# Patient Record
Sex: Female | Born: 1975 | Race: White | Hispanic: Yes | Marital: Single | Smoking: Never smoker
Health system: Southern US, Community
[De-identification: ages and names within clinical notes are randomized; demographics above are authoritative.]

## PROBLEM LIST (undated history)

## (undated) DIAGNOSIS — F32A Depression, unspecified: Secondary | ICD-10-CM

## (undated) DIAGNOSIS — R519 Headache, unspecified: Secondary | ICD-10-CM

## (undated) DIAGNOSIS — G039 Meningitis, unspecified: Secondary | ICD-10-CM

## (undated) DIAGNOSIS — G8929 Other chronic pain: Secondary | ICD-10-CM

## (undated) HISTORY — DX: Headache, unspecified: R51.9

## (undated) HISTORY — DX: Meningitis, unspecified: G03.9

## (undated) HISTORY — PX: TUBAL LIGATION: SHX77

## (undated) HISTORY — DX: Other chronic pain: G89.29

## (undated) HISTORY — DX: Depression, unspecified: F32.A

---

## 2007-04-27 ENCOUNTER — Inpatient Hospital Stay (HOSPITAL_COMMUNITY): Admission: AD | Admit: 2007-04-27 | Discharge: 2007-04-27 | Payer: Self-pay | Admitting: Obstetrics & Gynecology

## 2007-04-28 ENCOUNTER — Ambulatory Visit (HOSPITAL_COMMUNITY): Admission: RE | Admit: 2007-04-28 | Discharge: 2007-04-28 | Payer: Self-pay | Admitting: Obstetrics & Gynecology

## 2007-04-28 ENCOUNTER — Ambulatory Visit: Payer: Self-pay | Admitting: Family Medicine

## 2007-05-12 ENCOUNTER — Ambulatory Visit (HOSPITAL_COMMUNITY): Admission: RE | Admit: 2007-05-12 | Discharge: 2007-05-12 | Payer: Self-pay | Admitting: Obstetrics & Gynecology

## 2007-05-12 ENCOUNTER — Ambulatory Visit: Payer: Self-pay | Admitting: Obstetrics & Gynecology

## 2007-05-19 ENCOUNTER — Ambulatory Visit (HOSPITAL_COMMUNITY): Admission: RE | Admit: 2007-05-19 | Discharge: 2007-05-19 | Payer: Self-pay | Admitting: Obstetrics & Gynecology

## 2007-05-26 ENCOUNTER — Encounter: Payer: Self-pay | Admitting: Obstetrics and Gynecology

## 2007-05-26 ENCOUNTER — Ambulatory Visit: Payer: Self-pay | Admitting: Family Medicine

## 2007-06-02 ENCOUNTER — Ambulatory Visit: Payer: Self-pay | Admitting: Obstetrics & Gynecology

## 2007-06-09 ENCOUNTER — Ambulatory Visit (HOSPITAL_COMMUNITY): Admission: RE | Admit: 2007-06-09 | Discharge: 2007-06-09 | Payer: Self-pay | Admitting: Obstetrics & Gynecology

## 2008-04-19 IMAGING — US US OB FOLLOW-UP
1 series · 14 of 28 positions shown · non-contrast
Comparison: none

OBSTETRICAL ULTRASOUND:
 This ultrasound was performed in The [HOSPITAL], and the [facility] report will be stored to [REDACTED] PACS.

[Series 1: us ob follow-up · 14 of 42 slices shown]
[im 2/42]
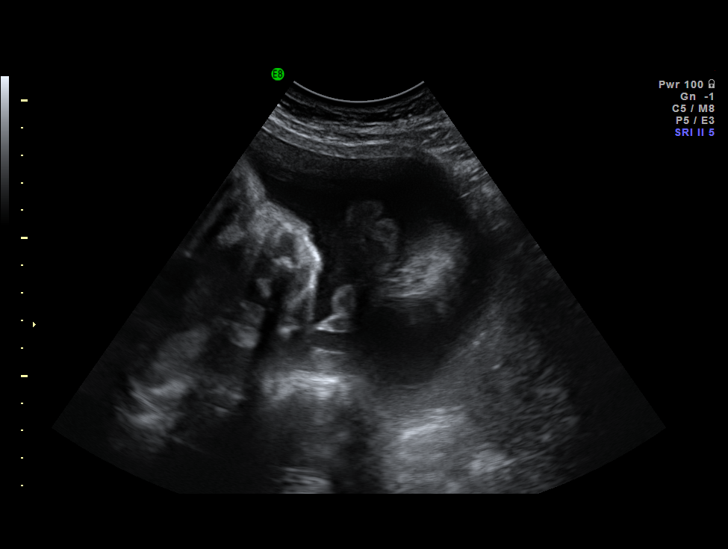
[im 5/42]
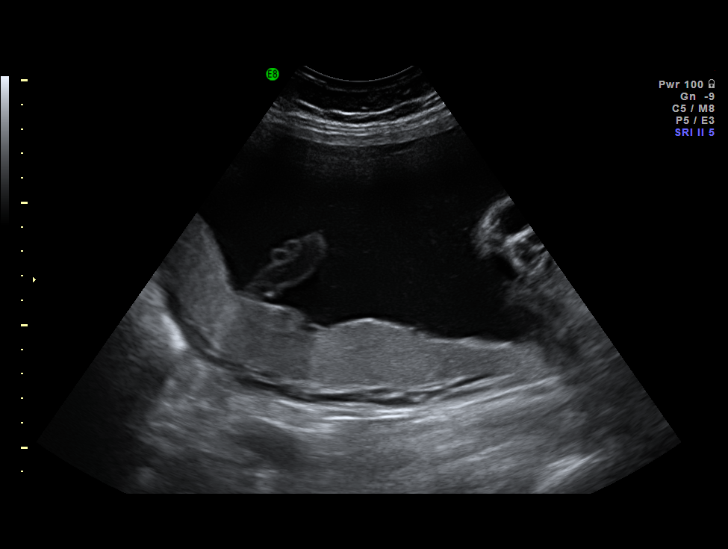
[im 8/42]
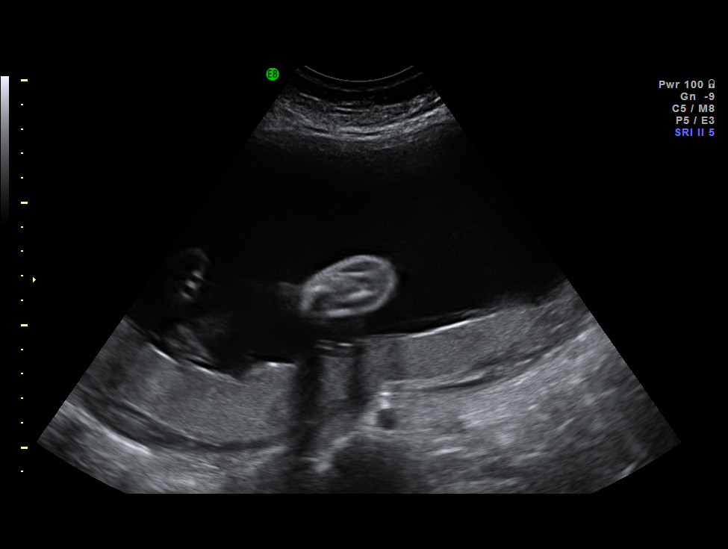
[im 11/42]
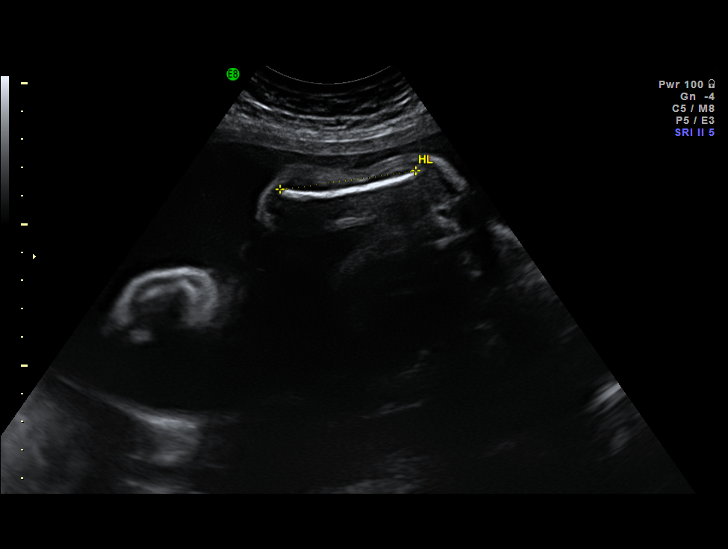
[im 14/42]
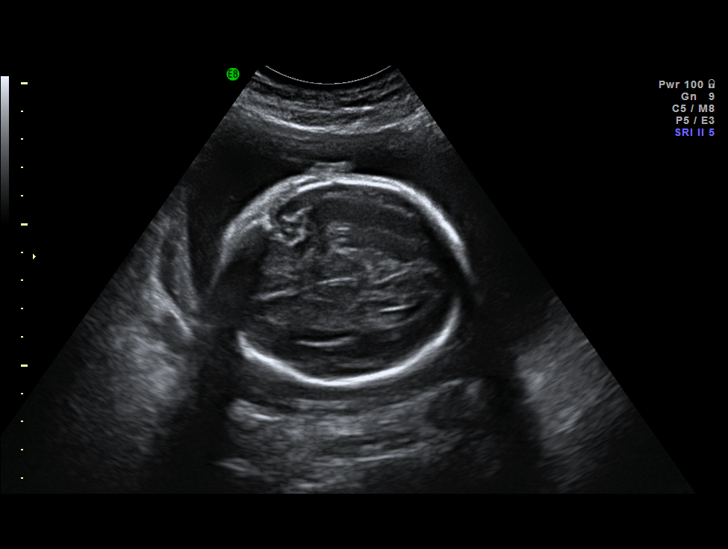
[im 17/42]
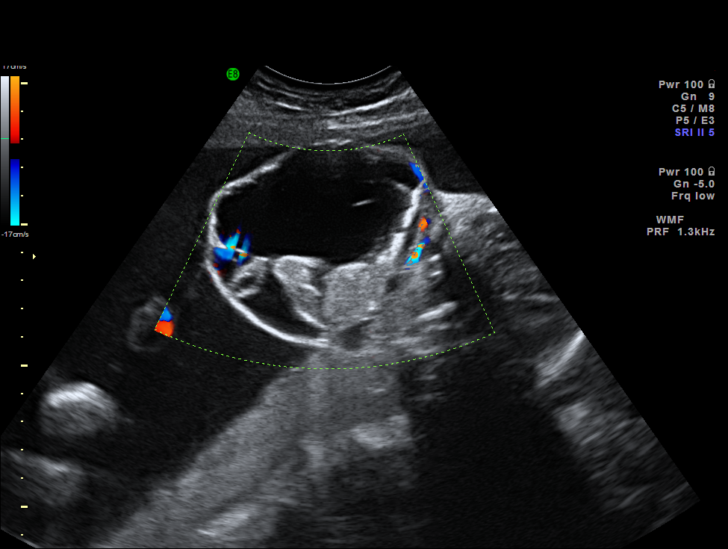
[im 20/42]
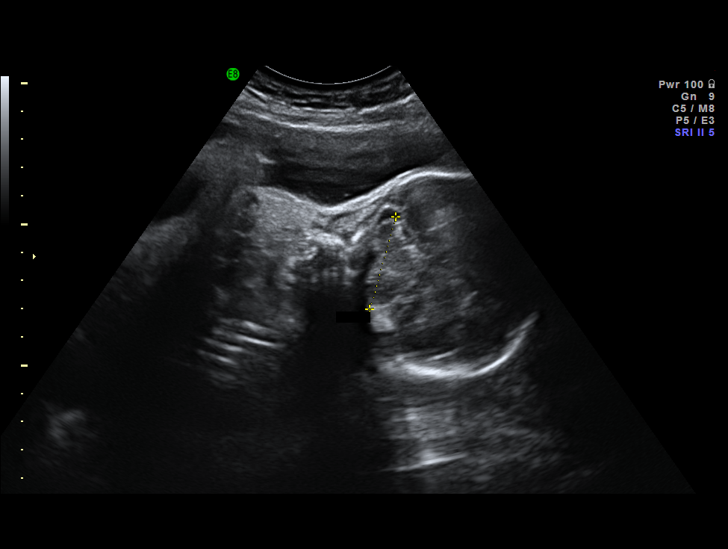
[im 23/42]
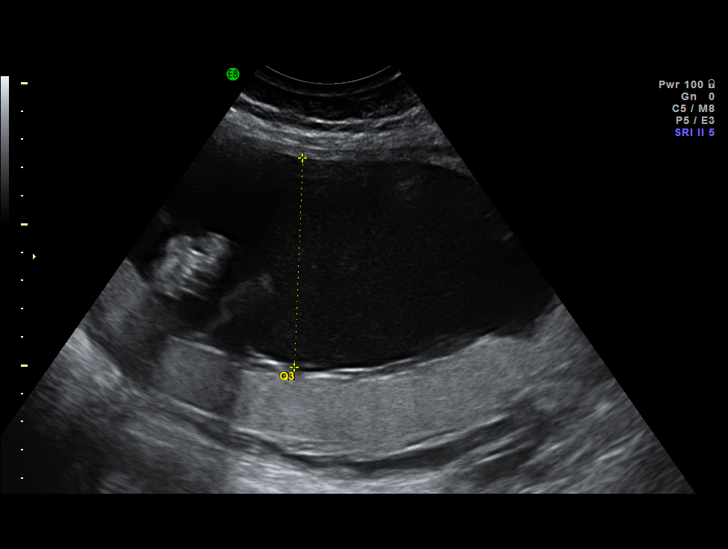
[im 26/42]
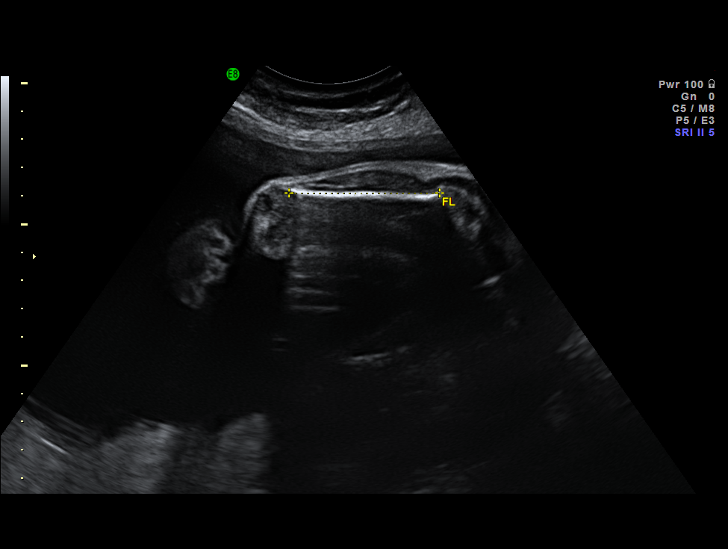
[im 29/42]
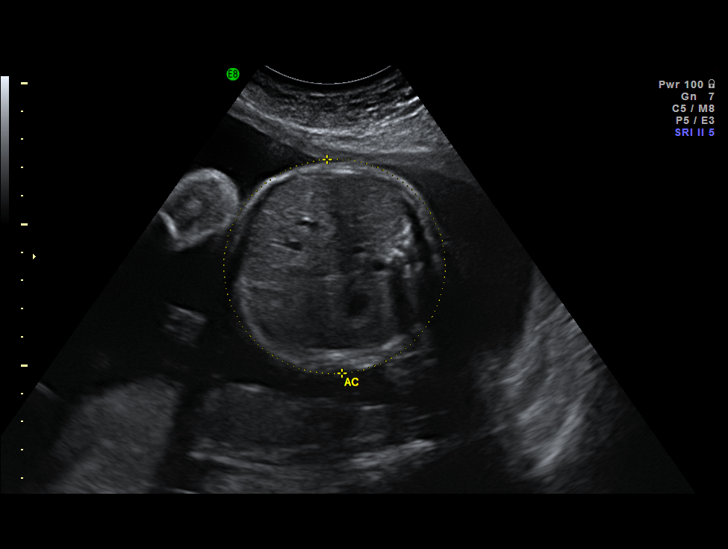
[im 32/42]
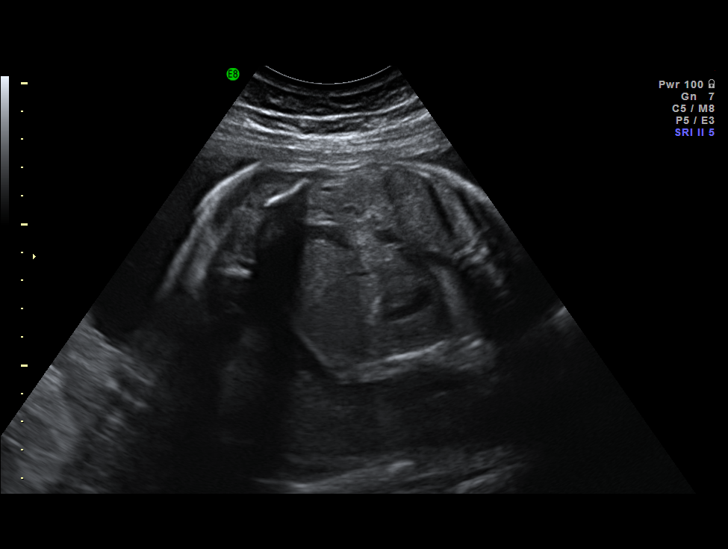
[im 35/42]
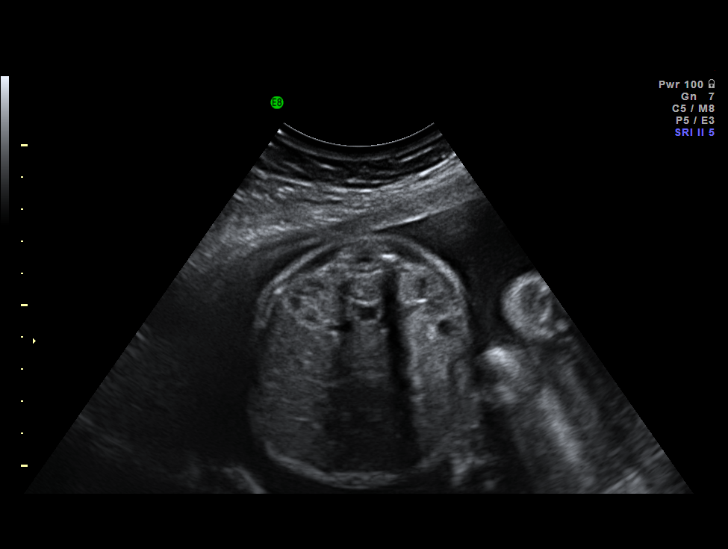
[im 38/42]
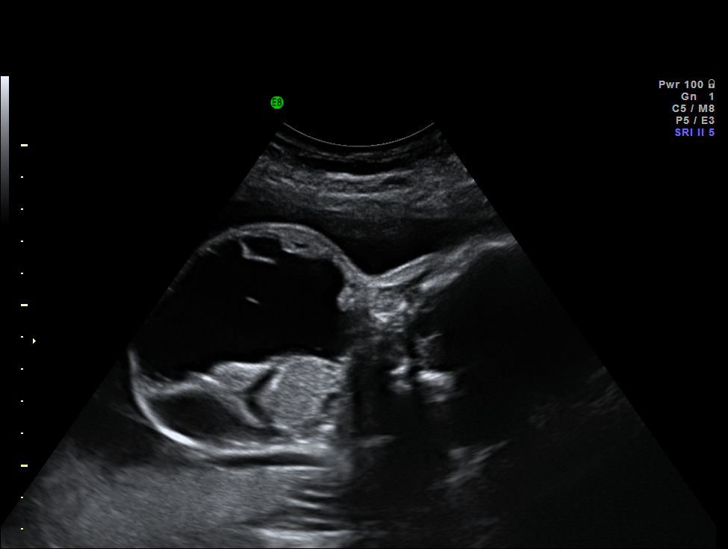
[im 42/42]
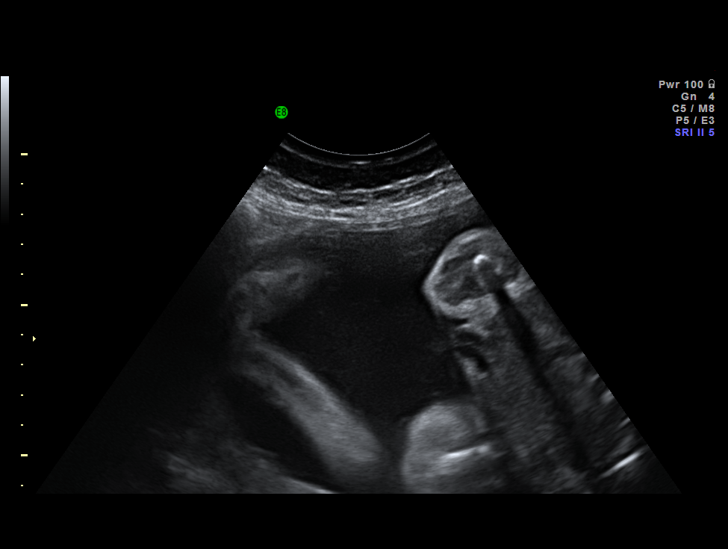

[14 of 28 positions shown; findings below may reference images not displayed]

IMPRESSION: The [facility] report has also been faxed to the ordering physician.

## 2009-11-01 ENCOUNTER — Inpatient Hospital Stay (HOSPITAL_COMMUNITY): Admission: EM | Admit: 2009-11-01 | Discharge: 2009-11-11 | Payer: Self-pay | Admitting: Emergency Medicine

## 2009-11-01 LAB — CONVERTED CEMR LAB
BUN: 10 mg/dL
Chloride: 112 meq/L
Creatinine, Ser: 0.6 mg/dL
Glucose, Bld: 97 mg/dL
HCT: 40.6 %
Hemoglobin: 13.7 g/dL
MCV: 93.4 fL
Platelets: 213 K/uL
Potassium: 4.9 meq/L
Sodium: 139 meq/L
WBC: 6.9 10*3/microliter

## 2009-11-21 ENCOUNTER — Ambulatory Visit: Payer: Self-pay | Admitting: Nurse Practitioner

## 2009-11-21 DIAGNOSIS — G030 Nonpyogenic meningitis: Secondary | ICD-10-CM | POA: Insufficient documentation

## 2009-11-21 DIAGNOSIS — R030 Elevated blood-pressure reading, without diagnosis of hypertension: Secondary | ICD-10-CM | POA: Insufficient documentation

## 2010-08-10 ENCOUNTER — Encounter: Payer: Self-pay | Admitting: *Deleted

## 2010-08-19 NOTE — Letter (Signed)
Summary: PT INFORMATION SHEET  PT INFORMATION SHEET   Imported By: Arta Bruce 01/16/2010 14:51:16  _____________________________________________________________________  External Attachment:    Type:   Image     Comment:   External Document

## 2010-08-19 NOTE — Assessment & Plan Note (Signed)
Summary: NEW - Establish Care   Vital Signs:  Patient profile:   35 year old female LMP:     11/06/2009 Weight:      152.7 pounds Temp:     98.1 degrees F oral Pulse rate:   65 / minute Pulse rhythm:   regular Resp:     16 per minute BP sitting:   142 / 77  (left arm) Cuff size:   regular  Vitals Entered By: Levon Hedger (Nov 21, 2009 11:22 AM) CC: follow-up visit Brittney [name] 12 day stay Is Patient Diabetic? No Pain Assessment Patient in pain? no       Does patient need assistance? Functional Status Self care Ambulation Normal LMP (date): 11/06/2009     Enter LMP: 11/06/2009   CC:  follow-up visit Brittney [name] 12 day stay.  History of Present Illness: Pt into the office to establish care. 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  She has already finished the doxycycline but still has some remaining does of the acyclovir.   2.  Intractable headache - morphine given in hospital. headache has resolved at this time.  3.  Dehydration - NS given in hospital drinking plenty of water at home and comments that she is even eating more than usual.  Nausea is still present intermittently  Pt had a prior history of meningitis (2004) and presented on April 15th with 3 days history of headache, nausea, body aches, neck stiffness, photophobia.  Pt was admitted with Dr. Vania Rea for further evaluation and stablization. Pregnancy test negative CSF cell count WBC elevated 343 LYME negative Blood culture negative crytococcal antigen titer negative HIV nonreactive RMSF - negative  Social - divorced but is being supported by her Ex-husband as she is not employed  Lawyer used for pt  Habits &  Providers  Alcohol-Tobacco-Diet     Alcohol drinks/day: 0     Tobacco Status: never  Exercise-Depression-Behavior     Drug Use: never  Allergies (verified): No Known Drug Allergies  Past History:  Past Surgical History: c-section x 2  Family History: mother - thyroid father - healthy  Social History: 2 children divorced tobacco - none ETOH - none Drug - noneSmoking Status:  never Drug Use:  never  Review of Systems General:  Complains of fatigue; denies fever and loss of appetite. Eyes:  Complains of blurring; left eye only. CV:  Denies chest pain or discomfort. Resp:  Denies cough and shortness of breath. GI:  Complains of nausea; denies constipation and loss of appetite. MS:  Denies joint pain.  Physical Exam  General:  alert.   Head:  normocephalic.   Eyes:  pupils equal, pupils round, and pupils reactive to light.   Neck:  no nuchal rigidity Lungs:  normal breath sounds.   Heart:  normal rate and regular rhythm.   Msk:  up to the exam table Neurologic:  gait normal.     Impression & Recommendations:  Problem # 1:  MENINGITIS (ICD-322.9) reviewed dx with pt she is progressing well - concerned that her left eye is still blurred. will order retasure eye exam  Problem # 2:  ELEVATED BLOOD PRESSURE WITHOUT DIAGNOSIS OF HYPERTENSION (ICD-796.2) will continue to  monitor  Complete  Medication List: 1)  Ibuprofen 400 Mg Tabs (Ibuprofen) .... Take one tablet by mouth three times a day as needed *charles nwaokcha 2)  Doxycycline Hyclate 100 Mg Tabs (Doxycycline hyclate) .... Take one tablet by mouth 3 times a day as needed *charles nwaolocha 3)  Acyclovir 400 Mg Tabs (Acyclovir) .... Take one tablet by mouth 3 times a day *charles nwaokocha  Patient Instructions: 1)  Finish taking all the antibiotics as ordered 2)  You will need to get an appointment for eligibility to get an orange card. 3)  After that then you can schedule an appointment for Retasure eye  exam to take a picture of your eyes 4)  Also schedule an appointment for a complete physical exam once you get an orange card.  Prevention & Chronic Care Immunizations   Influenza vaccine: Not documented    Tetanus booster: Not documented    Pneumococcal vaccine: Not documented  Other Screening   Pap smear: Not documented   Smoking status: never  (11/21/2009)    MRI Brain  Procedure date:  11/07/2009  Findings:      the paranasal sinuses and mastoid sinuses aer clear bilaterally  CXR  Procedure date:  11/04/2009  Findings:      no pneumothorax or complicating feature identified - Right sided PICC tip: lower SVC   MRI Brain  Procedure date:  11/07/2009  Findings:      the paranasal sinuses and mastoid sinuses aer clear bilaterally  CXR  Procedure date:  11/04/2009  Findings:      no pneumothorax or complicating feature identified - Right sided PICC tip: lower SVC  Appended Document: NEW - Establish Care     Vital Signs:  Patient profile:   35 year old female Height:      60.50 inches  Allergies: No Known Drug Allergies   Complete Medication List: 1)  Ibuprofen 400 Mg Tabs (Ibuprofen) .... Take one tablet by mouth three times a day as needed *charles nwaokcha 2)  Doxycycline Hyclate 100 Mg Tabs (Doxycycline hyclate) .... Take one tablet by mouth 3 times a day as needed *charles nwaolocha 3)  Acyclovir 400 Mg Tabs (Acyclovir) .... Take one tablet by mouth 3 times a day *charles nwaokocha

## 2010-10-07 LAB — COMPREHENSIVE METABOLIC PANEL
ALT: 14 U/L (ref 0–35)
Albumin: 3.2 g/dL — ABNORMAL LOW (ref 3.5–5.2)
Albumin: 3.4 g/dL — ABNORMAL LOW (ref 3.5–5.2)
Alkaline Phosphatase: 45 U/L (ref 39–117)
Alkaline Phosphatase: 48 U/L (ref 39–117)
Alkaline Phosphatase: 54 U/L (ref 39–117)
BUN: 10 mg/dL (ref 6–23)
BUN: 6 mg/dL (ref 6–23)
CO2: 20 mEq/L (ref 19–32)
CO2: 23 mEq/L (ref 19–32)
Chloride: 112 mEq/L (ref 96–112)
Chloride: 112 mEq/L (ref 96–112)
Creatinine, Ser: 0.6 mg/dL (ref 0.4–1.2)
Creatinine, Ser: 0.63 mg/dL (ref 0.4–1.2)
GFR calc Af Amer: >60 mL/min (ref >60)
GFR calc non Af Amer: >60 mL/min (ref >60)
Glucose, Bld: 169 mg/dL — ABNORMAL HIGH (ref 70–99)
Glucose, Bld: 97 mg/dL (ref 70–99)
Potassium: 3.5 mEq/L (ref 3.5–5.1)
Potassium: 3.8 mEq/L (ref 3.5–5.1)
Potassium: 4.9 mEq/L (ref 3.5–5.1)
Sodium: 138 mEq/L (ref 135–145)
Total Bilirubin: 0.5 mg/dL (ref 0.3–1.2)
Total Bilirubin: 1.1 mg/dL (ref 0.3–1.2)
Total Protein: 6.5 g/dL (ref 6.0–8.3)

## 2010-10-07 LAB — CARDIAC PANEL(CRET KIN+CKTOT+MB+TROPI)
Relative Index: INVALID (ref 0.0–2.5)
Relative Index: INVALID (ref 0.0–2.5)
Total CK: 55 U/L (ref 7–177)
Total CK: 59 U/L (ref 7–177)
Total CK: 59 U/L (ref 7–177)
Troponin I: 0.02 ng/mL (ref 0.00–0.06)
Troponin I: 0.06 ng/mL (ref 0.00–0.06)

## 2010-10-07 LAB — CBC
HCT: 35.2 % — ABNORMAL LOW (ref 36.0–46.0)
HCT: 40.6 % (ref 36.0–46.0)
Hemoglobin: 11.9 g/dL — ABNORMAL LOW (ref 12.0–15.0)
Hemoglobin: 11.9 g/dL — ABNORMAL LOW (ref 12.0–15.0)
MCHC: 33.2 g/dL (ref 30.0–36.0)
MCHC: 33.5 g/dL (ref 30.0–36.0)
MCHC: 33.7 g/dL (ref 30.0–36.0)
MCV: 93.4 fL (ref 78.0–100.0)
MCV: 93.4 fL (ref 78.0–100.0)
Platelets: 193 10*3/uL (ref 150–400)
Platelets: 213 10*3/uL (ref 150–400)
Platelets: 223 10*3/uL (ref 150–400)
RBC: 3.77 MIL/uL — ABNORMAL LOW (ref 3.87–5.11)
RBC: 3.99 MIL/uL (ref 3.87–5.11)
RBC: 4.35 MIL/uL (ref 3.87–5.11)
RDW: 13 % (ref 11.5–15.5)
RDW: 13.1 % (ref 11.5–15.5)
RDW: 13.3 % (ref 11.5–15.5)
WBC: 5.5 10*3/uL (ref 4.0–10.5)
WBC: 6.9 10*3/uL (ref 4.0–10.5)

## 2010-10-07 LAB — URINALYSIS, ROUTINE W REFLEX MICROSCOPIC
Bilirubin Urine: NEGATIVE
Glucose, UA: NEGATIVE mg/dL
pH: 6 (ref 5.0–8.0)

## 2010-10-07 LAB — BASIC METABOLIC PANEL
CO2: 24 mEq/L (ref 19–32)
CO2: 24 mEq/L (ref 19–32)
Calcium: 8.4 mg/dL (ref 8.4–10.5)
Calcium: 9.4 mg/dL (ref 8.4–10.5)
Creatinine, Ser: 0.63 mg/dL (ref 0.4–1.2)
Creatinine, Ser: 0.79 mg/dL (ref 0.4–1.2)
GFR calc Af Amer: >60 mL/min (ref >60)
GFR calc non Af Amer: >60 mL/min (ref >60)
Glucose, Bld: 100 mg/dL — ABNORMAL HIGH (ref 70–99)
Glucose, Bld: 94 mg/dL (ref 70–99)

## 2010-10-07 LAB — DIFFERENTIAL
Basophils Absolute: 0 10*3/uL (ref 0.0–0.1)
Basophils Relative: 0 % (ref 0–1)
Basophils Relative: 1 % (ref 0–1)
Eosinophils Absolute: 0 10*3/uL (ref 0.0–0.7)
Eosinophils Relative: 0 % (ref 0–5)
Eosinophils Relative: 1 % (ref 0–5)
Lymphs Abs: 1.9 10*3/uL (ref 0.7–4.0)
Monocytes Absolute: 0.1 10*3/uL (ref 0.1–1.0)
Monocytes Absolute: 0.3 10*3/uL (ref 0.1–1.0)
Monocytes Absolute: 0.4 10*3/uL (ref 0.1–1.0)
Monocytes Relative: 1 % — ABNORMAL LOW (ref 3–12)
Monocytes Relative: 4 % (ref 3–12)
Neutro Abs: 4.4 10*3/uL (ref 1.7–7.7)
Neutro Abs: 4.6 10*3/uL (ref 1.7–7.7)
Neutrophils Relative %: 49 % (ref 43–77)
Neutrophils Relative %: 85 % — ABNORMAL HIGH (ref 43–77)

## 2010-10-07 LAB — CSF CELL COUNT WITH DIFFERENTIAL
Eosinophils, CSF: 0 % (ref 0–1)
Eosinophils, CSF: 0 % (ref 0–1)
Lymphs, CSF: 99 % — ABNORMAL HIGH (ref 40–80)
Monocyte-Macrophage-Spinal Fluid: 1 % — ABNORMAL LOW (ref 15–45)
RBC Count, CSF: 7 /mm3 — ABNORMAL HIGH
Segmented Neutrophils-CSF: 0 % (ref 0–6)
Segmented Neutrophils-CSF: 1 % (ref 0–6)
Tube #: 1
Tube #: 4
WBC, CSF: 263 /mm3 (ref 0–5)

## 2010-10-07 LAB — IGG, IGA, IGM
IgA: 85 mg/dL (ref 68–378)
IgM, Serum: 81 mg/dL (ref 60–263)

## 2010-10-07 LAB — CRYPTOCOCCAL ANTIGEN, CSF: Crypto Ag: NEGATIVE

## 2010-10-07 LAB — PROTEIN, CSF: Total  Protein, CSF: 123 mg/dL — ABNORMAL HIGH (ref 15–45)

## 2010-10-07 LAB — CSF CULTURE W GRAM STAIN: Culture: NO GROWTH

## 2010-10-07 LAB — LYME DISEASE DNA BY PCR(BORRELIA BURG): Lyme Disease(B.burgdorferi)PCR: NOT DETECTED

## 2010-10-07 LAB — CULTURE, BLOOD (ROUTINE X 2): Culture: NO GROWTH

## 2010-10-07 LAB — HIV ANTIBODY (ROUTINE TESTING W REFLEX): HIV: NONREACTIVE

## 2010-10-07 LAB — GLUCOSE, CSF: Glucose, CSF: 44 mg/dL (ref 43–76)

## 2010-10-07 LAB — AFB CULTURE WITH SMEAR (NOT AT ARMC): Acid Fast Smear: NONE SEEN

## 2011-04-28 LAB — POCT URINALYSIS DIP (DEVICE)
Bilirubin Urine: NEGATIVE
Hgb urine dipstick: NEGATIVE
Hgb urine dipstick: NEGATIVE
Ketones, ur: NEGATIVE
Ketones, ur: NEGATIVE
Specific Gravity, Urine: 1.025
pH: 5
pH: 6.5

## 2011-04-29 LAB — POCT URINALYSIS DIP (DEVICE)
Hgb urine dipstick: NEGATIVE
Ketones, ur: NEGATIVE
pH: 6.5

## 2011-04-30 LAB — CBC
Hemoglobin: 11.8 — ABNORMAL LOW
Platelets: 236
RBC: 3.57 — ABNORMAL LOW
RDW: 13.8

## 2011-04-30 LAB — WET PREP, GENITAL
Trich, Wet Prep: NONE SEEN
Yeast Wet Prep HPF POC: NONE SEEN

## 2011-04-30 LAB — ABO/RH: ABO/RH(D): A POS

## 2011-04-30 LAB — HEPATITIS B SURFACE ANTIGEN: Hepatitis B Surface Ag: NEGATIVE

## 2011-04-30 LAB — POCT URINALYSIS DIP (DEVICE)
Ketones, ur: NEGATIVE
pH: 6

## 2011-04-30 LAB — URINALYSIS, ROUTINE W REFLEX MICROSCOPIC
Bilirubin Urine: NEGATIVE
Glucose, UA: NEGATIVE
Hgb urine dipstick: NEGATIVE
Ketones, ur: NEGATIVE
Protein, ur: NEGATIVE
pH: 6.5

## 2011-04-30 LAB — DIFFERENTIAL
Basophils Relative: 0
Eosinophils Absolute: 0
Eosinophils Relative: 1
Lymphs Abs: 1.2

## 2011-04-30 LAB — TYPE AND SCREEN
ABO/RH(D): A POS
Antibody Screen: NEGATIVE

## 2011-09-29 ENCOUNTER — Ambulatory Visit: Payer: Self-pay | Admitting: Family Medicine

## 2011-09-29 VITALS — BP 126/80 | HR 66 | Temp 98.4°F | Resp 16 | Ht 66.0 in | Wt 145.0 lb

## 2011-09-29 DIAGNOSIS — R110 Nausea: Secondary | ICD-10-CM

## 2011-09-29 DIAGNOSIS — R51 Headache: Secondary | ICD-10-CM

## 2011-09-29 MED ORDER — BUTALBITAL-APAP-CAFFEINE 50-325-40 MG PO TABS: 1.0000 | ORAL_TABLET | Freq: Four times a day (QID) | ORAL | Status: DC | PRN

## 2011-09-29 MED ORDER — PROMETHAZINE HCL 25 MG PO TABS: 25.0000 mg | ORAL_TABLET | Freq: Three times a day (TID) | ORAL | Status: AC | PRN

## 2011-09-29 MED ORDER — KETOROLAC TROMETHAMINE 30 MG/ML IJ SOLN
30.0000 mg | Freq: Once | INTRAMUSCULAR | Status: AC
  Administered 2011-09-29: 30 mg via INTRAMUSCULAR

## 2011-09-29 NOTE — Progress Notes (Signed)
  [facility]:  Office Visit  Chief Complaint:  Chief Complaint  Patient presents with  . Nausea    x 4 days  . Fatigue  . Headache    HPI: Brittney [name] is a 36 y.o. female who complains of  Temporal HA and sinus pressure , associated with cough, ear pressure, and feeling tired. She denies any fevers, chills; tried Ibuprofen without relief. She has a h/o meningtitis x 2 but this does not feel like it is meningitis. No neck pain, no rashes. + nausea but no vomitting. + tubal ligation. Denies any vision changes however light makes her HA a little worse, noise does not bother her.   Past Medical History  Diagnosis Date  . Meningitis 2010, 2007   Past Surgical History  Procedure Date  . Cesarean section    History   Social History  . Marital Status: Single    Spouse Name: N/A    Number of Children: N/A  . Years of Education: N/A   Social History Main Topics  . Smoking status: Never Smoker   . Smokeless tobacco: Not on file  . Alcohol Use: No  . Drug Use: No  . Sexually Active: Not on file   Other Topics Concern  . Not on file   Social History Narrative  . No narrative on file   No family history on file. No Known Allergies Prior to Admission medications   Not on File     ROS: The patient denies fevers, chills, night sweats, unintentional weight loss, chest pain, palpitations, wheezing, dyspnea on exertion, nausea, vomiting, abdominal pain, dysuria, hematuria, melena, numbness, weakness, or tingling. + HA, nausea  All other systems have been reviewed and were otherwise negative with the exception of those mentioned in the HPI and as above.    PHYSICAL EXAM: Filed Vitals:   09/29/11 1916  BP: 126/80  Pulse: 66  Temp: 98.4 F (36.9 C)  Resp: 16   Filed Vitals:   09/29/11 1916  Height: 5\' 6"  (1.676 m)  Weight: 145 lb (65.772 kg)   Body mass index is 23.40 kg/(m^2).  General: Alert, no acute distress HEENT:  Normocephalic, atraumatic,  oropharynx patent. EOMI, PERRLA, fundoscopic exam normal Cardiovascular:  Regular rate and rhythm, no rubs murmurs or gallops.  No Carotid bruits, radial pulse intact. No pedal edema.  Respiratory: Clear to auscultation bilaterally.  No wheezes, rales, or rhonchi.  No cyanosis, no use of accessory musculature GI: No organomegaly, abdomen is soft and non-tender, positive bowel sounds.  No masses. Skin: No rashes. Neurologic: Facial musculature symmetric. CN 2-12 grossly intact. No  meningeal signs Psychiatric: Patient is appropriate throughout our interaction. Lymphatic: No cervical lymphadenopathy Musculoskeletal: Gait intact.  EKG/XRAY:   Primary read interpreted by Dr. Conley Rolls at [facility].   ASSESSMENT/PLAN: Encounter Diagnoses  Name Primary?  . Headache Yes  . Nausea    Patient was given Toradaol 30 mg in office, a rx for fioricet and also for phenergan to take at home. She was advise that if she starts have worsening symptoms or if she feels like she did with meningitis she needs to go straight to the ER. We discussed what thoses s/sx are and at this time I think it is just a a headached sicn eshe has no meninigeal signs, no msk aches or neck stiffness. She has no insurance. We will continue to monitor.      Richrd Kuzniar PHUONG, DO 09/30/2011 7:51 AM

## 2011-10-01 DIAGNOSIS — R55 Syncope and collapse: Secondary | ICD-10-CM | POA: Insufficient documentation

## 2011-10-01 DIAGNOSIS — R51 Headache: Secondary | ICD-10-CM | POA: Insufficient documentation

## 2011-10-01 DIAGNOSIS — Z8661 Personal history of infections of the central nervous system: Secondary | ICD-10-CM | POA: Insufficient documentation

## 2011-10-01 DIAGNOSIS — R42 Dizziness and giddiness: Secondary | ICD-10-CM | POA: Insufficient documentation

## 2011-10-02 ENCOUNTER — Encounter (HOSPITAL_COMMUNITY): Payer: Self-pay | Admitting: Emergency Medicine

## 2011-10-02 ENCOUNTER — Emergency Department (HOSPITAL_COMMUNITY): Payer: Self-pay

## 2011-10-02 ENCOUNTER — Emergency Department (HOSPITAL_COMMUNITY)
Admission: EM | Admit: 2011-10-02 | Discharge: 2011-10-02 | Disposition: A | Discharge status: Home / Self Care | Payer: Self-pay | Attending: Emergency Medicine | Admitting: Emergency Medicine

## 2011-10-02 DIAGNOSIS — J329 Chronic sinusitis, unspecified: Secondary | ICD-10-CM

## 2011-10-02 LAB — DIFFERENTIAL
Lymphocytes Relative: 33 % (ref 12–46)
Monocytes Absolute: 0.4 10*3/uL (ref 0.1–1.0)
Monocytes Relative: 5 % (ref 3–12)
Neutro Abs: 4.9 10*3/uL (ref 1.7–7.7)

## 2011-10-02 LAB — URINALYSIS, ROUTINE W REFLEX MICROSCOPIC
Bilirubin Urine: NEGATIVE
Glucose, UA: NEGATIVE mg/dL
Ketones, ur: NEGATIVE mg/dL
Leukocytes, UA: NEGATIVE
pH: 6.5 (ref 5.0–8.0)

## 2011-10-02 LAB — BASIC METABOLIC PANEL
BUN: 9 mg/dL (ref 6–23)
CO2: 27 mEq/L (ref 19–32)
Chloride: 102 mEq/L (ref 96–112)
Creatinine, Ser: 0.64 mg/dL (ref 0.50–1.10)

## 2011-10-02 LAB — URINE MICROSCOPIC-ADD ON

## 2011-10-02 LAB — CBC
HCT: 39.7 % (ref 36.0–46.0)
Hemoglobin: 13.6 g/dL (ref 12.0–15.0)
MCHC: 34.3 g/dL (ref 30.0–36.0)
WBC: 8.3 10*3/uL (ref 4.0–10.5)

## 2011-10-02 MED ORDER — AZITHROMYCIN 500 MG PO TABS: 500.0000 mg | ORAL_TABLET | Freq: Every day | ORAL | Status: AC

## 2011-10-02 MED ORDER — HYDROCODONE-ACETAMINOPHEN 5-325 MG PO TABS: 1.0000 | ORAL_TABLET | Freq: Four times a day (QID) | ORAL | Status: AC | PRN

## 2011-10-02 MED ORDER — KETOROLAC TROMETHAMINE 15 MG/ML IJ SOLN
15.0000 mg | Freq: Once | INTRAMUSCULAR | Status: AC
  Administered 2011-10-02: 15 mg via INTRAVENOUS
  Filled 2011-10-02: qty 1

## 2011-10-02 MED ORDER — SODIUM CHLORIDE 0.9 % IV BOLUS (SEPSIS)
1000.0000 mL | Freq: Once | INTRAVENOUS | Status: AC
  Administered 2011-10-02: 1000 mL via INTRAVENOUS

## 2011-10-02 MED ORDER — DIPHENHYDRAMINE HCL 50 MG/ML IJ SOLN
25.0000 mg | Freq: Once | INTRAMUSCULAR | Status: AC
  Administered 2011-10-02: 25 mg via INTRAVENOUS
  Filled 2011-10-02: qty 1

## 2011-10-02 MED ORDER — METOCLOPRAMIDE HCL 5 MG/ML IJ SOLN
10.0000 mg | Freq: Once | INTRAMUSCULAR | Status: AC
  Administered 2011-10-02: 10 mg via INTRAVENOUS
  Filled 2011-10-02: qty 2

## 2011-10-02 NOTE — Discharge Instructions (Signed)
Sinusitis (Sinusitis) Los senos paranasales son bolsas de aire que se encuentran dentro de los huesos de la cara. La aparicin de bacterias en los senos paranasales lleva a una infeccin. Esta se denomina sinusitis.Estas infecciones generalmente son el resultado de una obstruccin en los orificios que drenan los senos.  SNTOMAS Generalmente, segn que seno se infecte, habr diferentes reas en las que puede aparecer dolor.   Los senos maxilares generalmente producen dolor detrs de los ojos.   La sinusitis frontal ocasiona dolor en el medio de la frente y sobre los ojos.  Entre otros problemas (sntomas) se incluyen:  Dolor en la zona posterior de los dientes superiores.   Una secrecin similar al pus (purulenta) proveniente de la nariz.   Toda inflamacin, calor o sensibilidad en estas mismas reas son indicios de infeccin.  TRATAMIENTO La sinusitis se diagnostica a travs del examen fsico y radiografas. Si le han tomado radiografas, asegrese de retirar los resultados. O consulte el modo en que podr obtenerlos. Su mdico le prescribir medicamentos (antibiticos). Estos medicamentos se indican para combatir la infeccin. Tambin le prescribir un descongestivo para reducir la inflamacin del seno paranasal.  INSTRUCCIONES PARA EL CUIDADO DOMICILIARIO  Utilice los medicamentos de venta libre o de prescripcin para el dolor, el malestar o la fiebre, segn se lo indique el profesional que lo asiste.   Beba gran cantidad de lquidos. Los lquidos ayudan a que las mucosas de los senos nasales drenen ms fcilmente.   Aplique bolsas de calor hmedo o hielo en las zonas ms doloridas para aliviar las molestias.   Utilice Sprays nasales salinos para ayudar a humedecer los senos nasales. Estos pueden encontrarse en la farmacia local.  SOLICITE ATENCIN MDICA DE INMEDIATO SI:  Tiene fiebre.   Dolor en aumento, dolor de cabeza intenso o dolor de dientes.   Nuseas, vmitos o  sudoracin.   Hinchazn infrecuente alrededor del rostro o problemas en la visin.  EST SEGURO QUE:   Comprende las instrucciones para el alta mdica.   Controlar su enfermedad.   Solicitar atencin mdica de inmediato segn las indicaciones.  Document Released: 04/15/2005 Document Revised: 06/25/2011 [employer] Patient Information 2012 [employer]. 

## 2011-10-02 NOTE — ED Notes (Signed)
Report received from Eric, RN-airway intact, no s/s's of distress 

## 2011-10-02 NOTE — ED Notes (Signed)
Pt presented to the Er with multiple complaints, pt reprots HA, chills, decrease of energy and states that all of these s/s are simlar to the ones she had when Dx with meningitis. Pt taking antiemetic and pain medications, Rx in the [facility].

## 2011-10-02 NOTE — ED Provider Notes (Signed)
History     CSN: [id number]  Arrival date & time 10/01/11  2338   First MD Initiated Contact with Patient 10/02/11 0248      Chief Complaint  Patient presents with  . Headache    (Consider location/radiation/quality/duration/timing/severity/associated sxs/prior treatment) HPI This is a 36 year old Hispanic female with an eight-day history of bitemporal headache. The pain is about a 7/10. It is associated with nausea but no vomiting. There's been no focal neurologic deficit but she has had general weakness and fatigue. She denies fever. She states her appetite has been decreased. She states she has felt lightheaded and had a syncopal episode yesterday afternoon. She also states that when she walks she feels off balance. She has a history of 2 episodes of meningitis in the past. One episode was in 2007 in [place], the other was in 2009 hearing [place]. On both occasions she had a headache accompanied by neck pain and stiffness. She has no neck pain or stiffness with this episode. She saw her primary care physician earlier in the week and was placed on Fioricet and Phenergan. This has not relieved her symptoms.  Past Medical History  Diagnosis Date  . Meningitis 2010, 2007    Past Surgical History  Procedure Date  . Cesarean section     History reviewed. No pertinent family history.  History  Substance Use Topics  . Smoking status: Never Smoker   . Smokeless tobacco: Not on file  . Alcohol Use: No    OB History    Grav Para Term Preterm Abortions TAB SAB Ect Mult Living                  Review of Systems  All other systems reviewed and are negative.    Allergies  Review of patient's allergies indicates no known allergies.  Home Medications   Current Outpatient Rx  Name Route Sig Dispense Refill  . BUTALBITAL-APAP-CAFFEINE 50-325-40 MG PO TABS Oral Take 1-2 tablets by mouth every 6 (six) hours as needed for headache. (por dolor in la cabeza) 30 tablet 0  .  PROMETHAZINE HCL 25 MG PO TABS Oral Take 1 tablet (25 mg total) by mouth every 8 (eight) hours as needed for nausea. (por nausea) 20 tablet 0    BP 129/90  Pulse 59  Temp(Src) 98.5 F (36.9 C) (Oral)  Resp 18  SpO2 100%  LMP 09/21/2011  Physical Exam General: Well-developed, well-nourished female in no acute distress; appearance consistent with age of record HENT: normocephalic, atraumatic Eyes: pupils equal round and reactive to light; extraocular muscles intact Neck: supple; full range of motion Heart: regular rate and rhythm Lungs: clear to auscultation bilaterally Abdomen: soft; nondistended; nontender; bowel sounds present Extremities: No deformity; full range of motion Neurologic: Awake, alert and oriented; motor function intact in all extremities and symmetric; no facial droop; normal coordination speech; negative Romberg; normal finger to nose Skin: Warm and dry     ED Course  Procedures (including critical care time)     MDM   Nursing notes and vitals signs, including pulse oximetry, reviewed.  Summary of this visit's results, reviewed by myself:  Labs:  Results for orders placed during the hospital encounter of 10/02/11  CBC      Component Value Range   WBC 8.3  4.0 - 10.5 (K/uL)   RBC 4.41  3.87 - 5.11 (MIL/uL)   Hemoglobin 13.6  12.0 - 15.0 (g/dL)   HCT 40.9  81.1 - 91.4 (%)  MCV 90.0  78.0 - 100.0 (fL)   MCH 30.8  26.0 - 34.0 (pg)   MCHC 34.3  30.0 - 36.0 (g/dL)   RDW 40.9  81.1 - 91.4 (%)   Platelets 291  150 - 400 (K/uL)  DIFFERENTIAL      Component Value Range   Neutrophils Relative 59  43 - 77 (%)   Neutro Abs 4.9  1.7 - 7.7 (K/uL)   Lymphocytes Relative 33  12 - 46 (%)   Lymphs Abs 2.7  0.7 - 4.0 (K/uL)   Monocytes Relative 5  3 - 12 (%)   Monocytes Absolute 0.4  0.1 - 1.0 (K/uL)   Eosinophils Relative 2  0 - 5 (%)   Eosinophils Absolute 0.2  0.0 - 0.7 (K/uL)   Basophils Relative 0  0 - 1 (%)   Basophils Absolute 0.0  0.0 - 0.1 (K/uL)    BASIC METABOLIC PANEL      Component Value Range   Sodium 137  135 - 145 (mEq/L)   Potassium 3.5  3.5 - 5.1 (mEq/L)   Chloride 102  96 - 112 (mEq/L)   CO2 27  19 - 32 (mEq/L)   Glucose, Bld 87  70 - 99 (mg/dL)   BUN 9  6 - 23 (mg/dL)   Creatinine, Ser 7.82  0.50 - 1.10 (mg/dL)   Calcium 9.5  8.4 - 95.6 (mg/dL)   GFR calc non Af Amer >90  >90 (mL/min)   GFR calc Af Amer >90  >90 (mL/min)  URINALYSIS, ROUTINE W REFLEX MICROSCOPIC      Component Value Range   Color, Urine YELLOW  YELLOW    APPearance CLEAR  CLEAR    Specific Gravity, Urine 1.013  1.005 - 1.030    pH 6.5  5.0 - 8.0    Glucose, UA NEGATIVE  NEGATIVE (mg/dL)   Hgb urine dipstick MODERATE (*) NEGATIVE    Bilirubin Urine NEGATIVE  NEGATIVE    Ketones, ur NEGATIVE  NEGATIVE (mg/dL)   Protein, ur NEGATIVE  NEGATIVE (mg/dL)   Urobilinogen, UA 0.2  0.0 - 1.0 (mg/dL)   Nitrite NEGATIVE  NEGATIVE    Leukocytes, UA NEGATIVE  NEGATIVE   POCT PREGNANCY, URINE      Component Value Range   Preg Test, Ur NEGATIVE  NEGATIVE   URINE MICROSCOPIC-ADD ON      Component Value Range   Squamous Epithelial / LPF RARE  RARE    RBC / HPF 0-2  <3 (RBC/hpf)    Imaging Studies: Ct Head Wo Contrast  10/02/2011  *RADIOLOGY REPORT*  Clinical Data: Headache, chills.  CT HEAD WITHOUT CONTRAST  Technique:  Contiguous axial images were obtained from the base of the skull through the vertex without contrast.  Comparison: 11/07/2009 MRI  Findings: There is no evidence for acute hemorrhage, hydrocephalus, mass lesion, or abnormal extra-axial fluid collection.  No definite CT evidence for acute infarction.  The right maxillary sinus mucosal thickening.  Mucosal thickening and partial opacification of the sphenoid chambers.  The mastoid air cells are clear.  IMPRESSION: No acute intracranial abnormality.  Right maxillary sinus mucosal thickening and partially opacified sphenoid chambers. Correlate clinically if concerned for acute sinusitis.  Original  Report Authenticated By: Waneta Martins, M.D.   6:19 AM Patient feeling much better after IV fluids and medication. We'll treat for sinusitis. There is no evidence on exam or other studies of acute meningitis. Septic meningitis would not be expected to persist for this length of  time with a benign course.      Hanley Seamen, MD 10/02/11 [phone]

## 2012-11-22 ENCOUNTER — Encounter (HOSPITAL_COMMUNITY): Payer: Self-pay | Admitting: *Deleted

## 2012-11-22 DIAGNOSIS — A879 Viral meningitis, unspecified: Principal | ICD-10-CM | POA: Diagnosis present

## 2012-11-22 NOTE — ED Notes (Signed)
NURSE FIRST ROUNDS : PT. SITTING AT WAITING AREA WITH NO PAIN OR DISCOMFORT , RESPIRATIONS UNLABORED , NURSE EXPLAINED DELAY AND PROCESS .

## 2012-11-22 NOTE — ED Notes (Signed)
Headache for 3 days .  No nv.  Difficult to get a history she speaks little english

## 2012-11-23 ENCOUNTER — Encounter (HOSPITAL_COMMUNITY): Payer: Self-pay | Admitting: Radiology

## 2012-11-23 ENCOUNTER — Emergency Department (HOSPITAL_COMMUNITY): Payer: No Typology Code available for payment source

## 2012-11-23 ENCOUNTER — Inpatient Hospital Stay (HOSPITAL_COMMUNITY)
Admission: EM | Admit: 2012-11-23 | Discharge: 2012-11-25 | DRG: 076 | Disposition: A | Discharge status: Home / Self Care | Payer: No Typology Code available for payment source | Attending: Family Medicine | Admitting: Family Medicine

## 2012-11-23 DIAGNOSIS — G039 Meningitis, unspecified: Secondary | ICD-10-CM

## 2012-11-23 DIAGNOSIS — A879 Viral meningitis, unspecified: Principal | ICD-10-CM

## 2012-11-23 DIAGNOSIS — M436 Torticollis: Secondary | ICD-10-CM

## 2012-11-23 DIAGNOSIS — R030 Elevated blood-pressure reading, without diagnosis of hypertension: Secondary | ICD-10-CM

## 2012-11-23 DIAGNOSIS — R51 Headache: Secondary | ICD-10-CM

## 2012-11-23 DIAGNOSIS — G030 Nonpyogenic meningitis: Secondary | ICD-10-CM | POA: Diagnosis present

## 2012-11-23 LAB — CSF CELL COUNT WITH DIFFERENTIAL
Eosinophils, CSF: 0 % (ref 0–1)
Eosinophils, CSF: 0 % (ref 0–1)
Lymphs, CSF: 85 % — ABNORMAL HIGH (ref 40–80)
Lymphs, CSF: 94 % — ABNORMAL HIGH (ref 40–80)
Monocyte-Macrophage-Spinal Fluid: 10 % — ABNORMAL LOW (ref 15–45)
Monocyte-Macrophage-Spinal Fluid: 6 % — ABNORMAL LOW (ref 15–45)
Tube #: 1
Tube #: 4

## 2012-11-23 LAB — CBC
HCT: 36 % (ref 36.0–46.0)
Hemoglobin: 12.7 g/dL (ref 12.0–15.0)
MCH: 31.1 pg (ref 26.0–34.0)
MCV: 88.2 fL (ref 78.0–100.0)
Platelets: 230 10*3/uL (ref 150–400)
RBC: 4.08 MIL/uL (ref 3.87–5.11)
WBC: 8 10*3/uL (ref 4.0–10.5)

## 2012-11-23 LAB — GRAM STAIN

## 2012-11-23 LAB — BASIC METABOLIC PANEL
BUN: 14 mg/dL (ref 6–23)
GFR calc Af Amer: >90 mL/min (ref >90)
GFR calc non Af Amer: >90 mL/min (ref >90)
Potassium: 3.6 mEq/L (ref 3.5–5.1)
Sodium: 137 mEq/L (ref 135–145)

## 2012-11-23 LAB — CBC WITH DIFFERENTIAL/PLATELET
Basophils Relative: 0 % (ref 0–1)
Eosinophils Absolute: 0.1 10*3/uL (ref 0.0–0.7)
MCH: 30.8 pg (ref 26.0–34.0)
MCHC: 34.6 g/dL (ref 30.0–36.0)
Monocytes Relative: 5 % (ref 3–12)
Neutrophils Relative %: 74 % (ref 43–77)
Platelets: 229 10*3/uL (ref 150–400)

## 2012-11-23 LAB — CREATININE, SERUM: GFR calc Af Amer: >90 mL/min (ref >90)

## 2012-11-23 LAB — RPR: RPR Ser Ql: NONREACTIVE

## 2012-11-23 LAB — HIV ANTIBODY (ROUTINE TESTING W REFLEX): HIV: NONREACTIVE

## 2012-11-23 LAB — CRYPTOCOCCAL ANTIGEN, CSF: Crypto Ag: NEGATIVE

## 2012-11-23 LAB — GLUCOSE, CSF: Glucose, CSF: 50 mg/dL (ref 43–76)

## 2012-11-23 LAB — PROTEIN, CSF: Total  Protein, CSF: 110 mg/dL — ABNORMAL HIGH (ref 15–45)

## 2012-11-23 MED ORDER — SODIUM CHLORIDE 0.9 % IV SOLN
Freq: Once | INTRAVENOUS | Status: AC
  Administered 2012-11-23: 06:00:00 via INTRAVENOUS

## 2012-11-23 MED ORDER — ONDANSETRON HCL 4 MG/2ML IJ SOLN
4.0000 mg | Freq: Four times a day (QID) | INTRAMUSCULAR | Status: DC | PRN
  Administered 2012-11-23 – 2012-11-24 (×2): 4 mg via INTRAVENOUS
  Filled 2012-11-23 (×2): qty 2

## 2012-11-23 MED ORDER — SODIUM CHLORIDE 0.9 % IV BOLUS (SEPSIS)
1000.0000 mL | Freq: Once | INTRAVENOUS | Status: AC
  Administered 2012-11-23: 1000 mL via INTRAVENOUS

## 2012-11-23 MED ORDER — DEXTROSE 5 % IV SOLN
10.0000 mg/kg | Freq: Three times a day (TID) | INTRAVENOUS | Status: DC
  Administered 2012-11-23 – 2012-11-25 (×7): 660 mg via INTRAVENOUS
  Filled 2012-11-23 (×9): qty 13.2

## 2012-11-23 MED ORDER — DOXYCYCLINE HYCLATE 100 MG IV SOLR
100.0000 mg | Freq: Two times a day (BID) | INTRAVENOUS | Status: DC
  Administered 2012-11-23: 100 mg via INTRAVENOUS
  Filled 2012-11-23 (×2): qty 100

## 2012-11-23 MED ORDER — SODIUM CHLORIDE 0.9 % IV SOLN
INTRAVENOUS | Status: DC
  Administered 2012-11-23: 1000 mL via INTRAVENOUS
  Administered 2012-11-23 – 2012-11-24 (×2): via INTRAVENOUS

## 2012-11-23 MED ORDER — VANCOMYCIN HCL IN DEXTROSE 1-5 GM/200ML-% IV SOLN
1000.0000 mg | Freq: Once | INTRAVENOUS | Status: AC
  Administered 2012-11-23: 1000 mg via INTRAVENOUS
  Filled 2012-11-23: qty 200

## 2012-11-23 MED ORDER — ACETAMINOPHEN 650 MG RE SUPP: 650.0000 mg | Freq: Four times a day (QID) | RECTAL | Status: DC | PRN

## 2012-11-23 MED ORDER — DEXAMETHASONE SODIUM PHOSPHATE 10 MG/ML IJ SOLN
10.0000 mg | Freq: Once | INTRAMUSCULAR | Status: AC
  Administered 2012-11-23: 10 mg via INTRAVENOUS
  Filled 2012-11-23: qty 1

## 2012-11-23 MED ORDER — MORPHINE SULFATE 2 MG/ML IJ SOLN
1.0000 mg | INTRAMUSCULAR | Status: DC | PRN
  Filled 2012-11-23: qty 1

## 2012-11-23 MED ORDER — ACETAMINOPHEN 325 MG PO TABS: 650.0000 mg | ORAL_TABLET | Freq: Four times a day (QID) | ORAL | Status: DC | PRN

## 2012-11-23 MED ORDER — ONDANSETRON HCL 4 MG/2ML IJ SOLN: 4.0000 mg | Freq: Three times a day (TID) | INTRAMUSCULAR | Status: DC | PRN

## 2012-11-23 MED ORDER — HYDROMORPHONE HCL PF 1 MG/ML IJ SOLN
0.5000 mg | INTRAMUSCULAR | Status: AC | PRN
  Administered 2012-11-23 (×2): 1 mg via INTRAVENOUS
  Filled 2012-11-23 (×2): qty 1

## 2012-11-23 MED ORDER — KETOROLAC TROMETHAMINE 30 MG/ML IJ SOLN
30.0000 mg | Freq: Once | INTRAMUSCULAR | Status: AC
  Administered 2012-11-23: 30 mg via INTRAVENOUS
  Filled 2012-11-23: qty 1

## 2012-11-23 MED ORDER — SODIUM CHLORIDE 0.9 % IV SOLN: INTRAVENOUS | Status: DC

## 2012-11-23 MED ORDER — SENNA 8.6 MG PO TABS
1.0000 | ORAL_TABLET | Freq: Every day | ORAL | Status: DC | PRN
  Administered 2012-11-24: 8.6 mg via ORAL
  Filled 2012-11-23 (×2): qty 1

## 2012-11-23 MED ORDER — ACYCLOVIR SODIUM 50 MG/ML IV SOLN
650.0000 mg | Freq: Once | INTRAVENOUS | Status: AC
  Administered 2012-11-23: 650 mg via INTRAVENOUS
  Filled 2012-11-23: qty 13

## 2012-11-23 MED ORDER — DEXTROSE 5 % IV SOLN
2.0000 g | Freq: Two times a day (BID) | INTRAVENOUS | Status: DC
  Administered 2012-11-23 – 2012-11-24 (×3): 2 g via INTRAVENOUS
  Filled 2012-11-23 (×6): qty 2

## 2012-11-23 MED ORDER — ONDANSETRON HCL 4 MG PO TABS: 4.0000 mg | ORAL_TABLET | Freq: Four times a day (QID) | ORAL | Status: DC | PRN

## 2012-11-23 MED ORDER — HYDROMORPHONE HCL PF 1 MG/ML IJ SOLN
0.5000 mg | INTRAMUSCULAR | Status: AC
  Administered 2012-11-23: 0.5 mg via INTRAVENOUS
  Filled 2012-11-23: qty 1

## 2012-11-23 MED ORDER — CEFTRIAXONE SODIUM 2 G IJ SOLR
2.0000 g | Freq: Once | INTRAMUSCULAR | Status: AC
  Administered 2012-11-23: 2 g via INTRAVENOUS
  Filled 2012-11-23: qty 2

## 2012-11-23 MED ORDER — KETOROLAC TROMETHAMINE 30 MG/ML IJ SOLN
30.0000 mg | Freq: Four times a day (QID) | INTRAMUSCULAR | Status: DC | PRN
  Administered 2012-11-24 (×2): 30 mg via INTRAVENOUS
  Filled 2012-11-23 (×2): qty 1

## 2012-11-23 MED ORDER — ONDANSETRON HCL 4 MG/2ML IJ SOLN
4.0000 mg | Freq: Once | INTRAMUSCULAR | Status: AC
  Administered 2012-11-23: 4 mg via INTRAVENOUS
  Filled 2012-11-23: qty 2

## 2012-11-23 MED ORDER — ENOXAPARIN SODIUM 40 MG/0.4ML ~~LOC~~ SOLN
40.0000 mg | SUBCUTANEOUS | Status: DC
  Administered 2012-11-23 – 2012-11-24 (×2): 40 mg via SUBCUTANEOUS
  Filled 2012-11-23 (×3): qty 0.4

## 2012-11-23 NOTE — ED Notes (Signed)
Pt

## 2012-11-23 NOTE — ED Notes (Signed)
RN called spanish interpreter for education about lumbar puncture for consent for procedure. MD at bedside with spanish interpreter on phone.

## 2012-11-23 NOTE — H&P (Signed)
Margarethe Virgen is an 37 y.o. female.  PCP: NONE  Chief Complaint: Headache, neck pain HPI: 37 yo F with history of aseptic meningitis x2 (most recently in 2011 with admission at [facility]) presents with 3 days of worsening headache and neck stiffness, which feels similar to her prior episodes of aseptic meningitis.  She reports that the neck pain and stiffness is worse than the headache.  She has tried ibuprofen, which did not help.  She endorses chills and some mild, non-specific vision changes as well as generalized weakness and fatigue.  She denies any rash, N/V/D, fever, numbness/tingling, specific weakness.  She reports that she has not traveled or gone camping in the past few months. [place] but has lived in the [place] for 14 years.  She does not currently work, and no one at home has been sick.  No new medications or foods in the past few weeks.  Her most recent episode of meningitis in 2011, at [facility], was treated with doxycyline and acyclovir.  She is not sure when exactly her first episode of meningitis was, but she was treated in [place].  It was likely between 2004-2008.    Past Medical History  Diagnosis Date  . Meningitis 2010, 2007    Past Surgical History  Procedure Laterality Date  . Cesarean section      No family history on file. Social History:  reports that she has never smoked. She does not have any smokeless tobacco history on file. She reports that she does not drink alcohol or use illicit drugs.  Allergies: No Known Allergies   (Not in a hospital admission)  Results for orders placed during the hospital encounter of 11/23/12 (from the past 48 hour(s))  CBC WITH DIFFERENTIAL     Status: None   Collection Time    11/23/12 12:41 AM      Result Value Range   WBC 9.2  4.0 - 10.5 K/uL   RBC 4.28  3.87 - 5.11 MIL/uL   Hemoglobin 13.2  12.0 - 15.0 g/dL   HCT 96.0  45.4 - 09.8 %   MCV 89.3  78.0 - 100.0 fL   MCH 30.8  26.0 - 34.0 pg   MCHC 34.6  30.0 - 36.0  g/dL   RDW 11.9  14.7 - 82.9 %   Platelets 229  150 - 400 K/uL   Neutrophils Relative 74  43 - 77 %   Neutro Abs 6.9  1.7 - 7.7 K/uL   Lymphocytes Relative 20  12 - 46 %   Lymphs Abs 1.8  0.7 - 4.0 K/uL   Monocytes Relative 5  3 - 12 %   Monocytes Absolute 0.4  0.1 - 1.0 K/uL   Eosinophils Relative 1  0 - 5 %   Eosinophils Absolute 0.1  0.0 - 0.7 K/uL   Basophils Relative 0  0 - 1 %   Basophils Absolute 0.0  0.0 - 0.1 K/uL  BASIC METABOLIC PANEL     Status: Abnormal   Collection Time    11/23/12 12:41 AM      Result Value Range   Sodium 137  135 - 145 mEq/L   Potassium 3.6  3.5 - 5.1 mEq/L   Chloride 103  96 - 112 mEq/L   CO2 24  19 - 32 mEq/L   Glucose, Bld 105 (*) 70 - 99 mg/dL   BUN 14  6 - 23 mg/dL   Creatinine, Ser 5.62  0.50 -  1.10 mg/dL   Calcium 9.2  8.4 - 16.1 mg/dL   GFR calc non Af Amer >90  >90 mL/min   GFR calc Af Amer >90  >90 mL/min   Comment:            The eGFR has been calculated     using the CKD EPI equation.     This calculation has not been     validated in all clinical     situations.     eGFR's persistently     <90 mL/min signify     possible Chronic Kidney Disease.  GRAM STAIN     Status: None   Collection Time    11/23/12  4:20 AM      Result Value Range   Specimen Description CSF     Special Requests Normal     Gram Stain       Value: [id number]     WBC PRESENT, PREDOMINANTLY PMN     NO ORGANISMS SEEN   Report Status PENDING    CSF CELL COUNT WITH DIFFERENTIAL     Status: Abnormal   Collection Time    11/23/12  4:23 AM      Result Value Range   Tube # 1     Color, CSF COLORLESS  COLORLESS   Appearance, CSF HAZY (*) CLEAR   Supernatant NOT INDICATED     RBC Count, CSF 144 (*) 0 /cu mm   WBC, CSF 78 (*) 0 - 5 /cu mm   Comment: CRITICAL RESULT CALLED TO, READ BACK BY AND VERIFIED WITH:     J.CAMP,RN 0535 11/23/12 M.CAMPBELL   Segmented Neutrophils-CSF 0  0 - 6 %   Lymphs, CSF 94 (*) 40 - 80 %   Monocyte-Macrophage-Spinal Fluid 6 (*)  15 - 45 %   Eosinophils, CSF 0  0 - 1 %  CSF CELL COUNT WITH DIFFERENTIAL     Status: Abnormal   Collection Time    11/23/12  4:23 AM      Result Value Range   Tube # 4     Color, CSF COLORLESS  COLORLESS   Appearance, CSF HAZY (*) CLEAR   Supernatant NOT INDICATED     RBC Count, CSF 6 (*) 0 /cu mm   WBC, CSF 56 (*) 0 - 5 /cu mm   Comment: CRITICAL RESULT CALLED TO, READ BACK BY AND VERIFIED WITH:     J.CAMP,RN 0535 11/23/12 M.CAMPBELL   Segmented Neutrophils-CSF 5  0 - 6 %   Lymphs, CSF 85 (*) 40 - 80 %   Monocyte-Macrophage-Spinal Fluid 10 (*) 15 - 45 %   Eosinophils, CSF 0  0 - 1 %  GLUCOSE, CSF     Status: None   Collection Time    11/23/12  4:27 AM      Result Value Range   Glucose, CSF 50  43 - 76 mg/dL  PROTEIN, CSF     Status: Abnormal   Collection Time    11/23/12  4:27 AM      Result Value Range   Total  Protein, CSF 110 (*) 15 - 45 mg/dL   Ct Head Wo Contrast  11/23/2012  *RADIOLOGY REPORT*  Clinical Data: 37 year old female with headache times 3 days.  CT HEAD WITHOUT CONTRAST  Technique:  Contiguous axial images were obtained from the base of the skull through the vertex without contrast.  Comparison: 10/02/2011 and earlier.  Findings: Interval improved paranasal sinus aeration.  Small fluid  levels in the sphenoid sinuses.  Residual mucosal thickening or mucous retention cyst at the left frontal recess.  Mastoids are clear.  Visualized orbits and scalp soft tissues are within normal limits. No acute osseous abnormality identified.  Stable and normal cerebral volume.  No midline shift, ventriculomegaly, mass effect, evidence of mass lesion, intracranial hemorrhage or evidence of cortically based acute infarction.  Gray-white matter differentiation is within normal limits throughout the brain.  No suspicious intracranial vascular hyperdensity.  IMPRESSION: 1.  Stable and normal noncontrast CT appearance of the brain. 2.  Interval decreased paranasal sinus inflammatory changes.    Original Report Authenticated By: Erskine Speed, M.D.     Review of Systems  Constitutional: Positive for chills and malaise/fatigue. Negative for fever.  HENT: Positive for ear pain and neck pain. Negative for hearing loss, congestion, sore throat, tinnitus and ear discharge.   Eyes: Positive for pain. Negative for blurred vision, double vision, photophobia and redness.  Respiratory: Negative for cough, shortness of breath and wheezing.   Cardiovascular: Negative for chest pain, palpitations and leg swelling.  Gastrointestinal: Negative for nausea, vomiting, abdominal pain, diarrhea and constipation.  Genitourinary: Negative for dysuria.  Musculoskeletal: Negative for joint pain and falls.  Skin: Negative for itching and rash.  Neurological: Positive for weakness and headaches. Negative for dizziness, tingling, sensory change, focal weakness, seizures and loss of consciousness.    Blood pressure 106/54, pulse 66, temperature 98.7 F (37.1 C), temperature source Oral, resp. rate 15, last menstrual period 11/16/2012, SpO2 98.00%. Physical Exam  Constitutional: She is oriented to person, place, and time. She appears well-developed and well-nourished. She appears distressed.  HENT:  Head: Normocephalic and atraumatic.  Right Ear: Tympanic membrane, external ear and ear canal normal.  Left Ear: Tympanic membrane, external ear and ear canal normal.  Nose: Nose normal. No mucosal edema or rhinorrhea.  Mouth/Throat: Oropharynx is clear and moist and mucous membranes are normal. Mucous membranes are not pale and not dry. No oropharyngeal exudate, posterior oropharyngeal edema, posterior oropharyngeal erythema or tonsillar abscesses.  Eyes: Conjunctivae and EOM are normal. Pupils are equal, round, and reactive to light. Right eye exhibits no discharge. Left eye exhibits no discharge. Right conjunctiva is not injected. Left conjunctiva is not injected. No scleral icterus.  Fundoscopic exam:       The right eye shows no papilledema.       The left eye shows no papilledema.  Neck: Trachea normal. No rigidity. Decreased range of motion present. No Brudzinski's sign and no Kernig's sign noted. No thyromegaly present.  Mildly decreased ROM in neck both side to side and chin to chest, but only mild pain (pt reports pain was more severe earlier today)  Cardiovascular: Normal rate, regular rhythm, normal heart sounds and intact distal pulses.   No murmur heard. Respiratory: Effort normal and breath sounds normal. No respiratory distress. She has no wheezes. She has no rales.  GI: Soft. Bowel sounds are normal. She exhibits no distension and no mass. There is no tenderness.  Musculoskeletal: She exhibits no edema.  Lymphadenopathy:    She has no cervical adenopathy.  Neurological: She is alert and oriented to person, place, and time. She displays no tremor and normal reflexes. No cranial nerve deficit or sensory deficit. She exhibits normal muscle tone. Coordination normal.  Did not walk patient due to increased pain with sitting and standing  Skin: Skin is warm and dry. No rash noted. She is not diaphoretic. No erythema.  Assessment/Plan 37 yo [name] with history of aseptic meningitis presents with headache and neck stiffness x3 days.  1. Headache and neck stiffness: likely aseptic or viral meningitis (based on CSF results), ddx includes bacterial vs fungal vs Lyme disease vs RMSF vs Syphilis vs drug-induced vs other -Will admit to med-surg floor with isolation precautions while awaiting cultures to confirm that this is not bacterial meningitis -S/p Vanc, CTX, acyclovir and dexamethasone in ED  -Will treat with: CTX, doxy (RMSF, Lyme), Acyclovir (HSV) -Blood and CSF cultures pending -Will check CSF for AFB, cryptococcal antigen, HSV -Check blood for Lyme and RMSF serologies as well as RPR and HIV -Will treat pain with PRN tylenol, Toradol, and Morphine -Could consider ID and/or Neuro  consult if desired given this is patient's 3rd episode of meningitis   2. FEN/GI: NS @ 100cc/hr, regular diet 3. Ppx: Lovenox SQ 4. Dispo: D/c pending clinical improvement, r/o bacterial meningitis    Francoise Chojnowski 11/23/2012, 8:47 AM

## 2012-11-23 NOTE — ED Notes (Signed)
Pt transported to CT ?

## 2012-11-23 NOTE — H&P (Signed)
FMTS Attending Admission Note: Brittney Eniola,MD I  have seen and examined this patient, reviewed their chart. I have discussed this patient with the resident. I agree with the resident's findings, assessment and care plan.Currently afebrile,had a single temp reading of 100.4  Briefly: Patient is a 37 y/o with recurrent meningitis mostly aseptic with the last 2 she had,patient feels better now after ER treatment,she denies headache,no N/V,no change in her vision,does endorse neck pain,no seizures or LOC.  O/E: Alert,oriented not in distress,she is comfortable in bed.         Resp/CV: WNL;         Neuro: DTR normal,normal tone globally,neg kernig sign,?? babinski or due to neck pain.Cervical tenderness with palpation down her cervical spine.         GI: Benign.         Ext: Normal.  A/P: Possible recurrent aseptic meningitis;         LP report reviewed consistent with viral meningitis         Awaiting culture report.         May continue Acyclovir and Doxy.         May not need Ceftriaxone at this time as LP and CBC not suggestive of bacterial infection,but will keep her on for now.         Request ID consult due to prior hx of recurrent aseptic meningitis.

## 2012-11-23 NOTE — ED Notes (Signed)
Report from [place], [place].  Vancomycin started at 709. Called pharmacy to clarify Va

## 2012-11-23 NOTE — ED Notes (Signed)
RN walked CSF specimens to lab

## 2012-11-23 NOTE — ED Notes (Signed)
RN and MD at bedside for lumbar puncture procedure.

## 2012-11-23 NOTE — ED Provider Notes (Signed)
History     CSN: [id number]  Arrival date & time 11/22/12  2121   First MD Initiated Contact with Patient 11/23/12 0026      Chief Complaint  Patient presents with  . Headache    HPI Arna Luis is a 37 y.o. female who presents with 2 days history of constant severe, aching headache as well as painful stiff neck and ear pain. She denies any nausea or vomiting, no chest pain or shortness of breath. She denies any fevers or chills. She is a pertinent medical history for meningitis 4 years ago. She says this feels just like her meningitis.   Past Medical History  Diagnosis Date  . Meningitis 2010, 2007    Past Surgical History  Procedure Laterality Date  . Cesarean section      No family history on file.  History  Substance Use Topics  . Smoking status: Never Smoker   . Smokeless tobacco: Not on file  . Alcohol Use: No    OB History   Grav Para Term Preterm Abortions TAB SAB Ect Mult Living                  Review of Systems At least 10pt or greater review of systems completed and are negative except where specified in the HPI.  Allergies  Review of patient's allergies indicates no known allergies.  Home Medications   Current Outpatient Rx  Name  Route  Sig  Dispense  Refill  . ibuprofen (ADVIL,MOTRIN) 200 MG tablet   Oral   Take 200 mg by mouth every 6 (six) hours as needed for pain. For pain           BP 129/93  Pulse 89  Temp(Src) 100.4 F (38 C) (Oral)  Resp 14  SpO2 98%  Physical Exam  Nursing notes reviewed.  Electronic medical record reviewed. VITAL SIGNS:   Filed Vitals:   11/23/12 0308 11/23/12 0610 11/23/12 0656 11/23/12 0752  BP: 110/56 103/55 95/54 106/54  Pulse: 67 71 68 66  Temp: 99 F (37.2 C) 98.6 F (37 C)  98.7 F (37.1 C)  TempSrc: Oral Oral    Resp: 18 18 16 15   SpO2: 97% 100% 100% 98%   CONSTITUTIONAL: Awake, oriented x4, appears very uncomfortable HENT: Atraumatic, normocephalic, oral mucosa pink and moist, airway  patent. Nares patent without drainage. External ears normal. TMs are clear bilateral EYES: Conjunctiva mildly injected, EOMI, PERRLA NECK: Trachea midline, non-tender, supple CARDIOVASCULAR: Normal heart rate, Normal rhythm, No murmurs, rubs, gallops PULMONARY/CHEST: Clear to auscultation, no rhonchi, wheezes, or rales. Symmetrical breath sounds. Non-tender. ABDOMINAL: Non-distended, soft, non-tender - no rebound or guarding.  BS normal. NEUROLOGIC:  Facial sensation equal to light touch bilaterally.  Good muscle bulk in the masseter muscle and good lateral movement of the jaw.  Facial expressions equal and good strength with smile/frown and puffed cheeks.  Hearing grossly intact to finger rub test.  Uvula, tongue are midline with no deviation. Symmetrical palate elevation.  Trapezius and SCM muscles are 5/5 strength bilaterally.  Non-focal, moving all four extremities, no gross sensory or motor deficits. EXTREMITIES: No clubbing, cyanosis, or edema SKIN: Warm, Dry, No erythema, No rash  ED Course  LUMBAR PUNCTURE Date/Time: 11/23/2012 5:30 AM Performed by: Jones Skene Authorized by: Jones Skene Consent: Verbal consent obtained. written consent obtained. Risks and benefits: risks, benefits and alternatives were discussed (Using Spanish interpreter) Consent given by: patient Patient understanding: patient states understanding of the procedure being performed Patient  consent: the patient's understanding of the procedure matches consent given Procedure consent: procedure consent matches procedure scheduled Relevant documents: relevant documents present and verified Site marked: the operative site was marked Imaging studies: imaging studies available Patient identity confirmed: verbally with patient and arm band Time out: Immediately prior to procedure a "time out" was called to verify the correct patient, procedure, equipment, support staff and site/side marked as required. Indications:  evaluation for infection Anesthesia: local infiltration Local anesthetic: lidocaine 1% without epinephrine Anesthetic total: 6 ml Patient sedated: no Preparation: Patient was prepped and draped in the usual sterile fashion. Lumbar space: L3-L4 interspace Patient's position: sitting Needle gauge: 22 Needle type: spinal needle - Quincke tip Needle length: 3.5 in Number of attempts: 1 Fluid appearance: hazy. Tubes of fluid: 4 Total volume: 4 ml Post-procedure: site cleaned and adhesive bandage applied Patient tolerance: Patient tolerated the procedure well with no immediate complications.   (including critical care time)  CRITICAL CARE Performed by: Jones Skene Total critical care time: 30 Critical care time was exclusive of separately billable procedures and treating other patients. Critical care was necessary to treat or prevent imminent or life-threatening deterioration. Critical care was time spent personally by me on the following activities: development of treatment plan with patient and/or surrogate as well as nursing, discussions with consultants, evaluation of patient's response to treatment, examination of patient, obtaining history from patient or surrogate, ordering and performing treatments and interventions, ordering and review of laboratory studies, ordering and review of radiographic studies, pulse oximetry and re-evaluation of patient's condition.  Labs Reviewed  BASIC METABOLIC PANEL - Abnormal; Notable for the following:    Glucose, Bld 105 (*)    All other components within normal limits  CSF CELL COUNT WITH DIFFERENTIAL - Abnormal; Notable for the following:    Appearance, CSF HAZY (*)    RBC Count, CSF 144 (*)    WBC, CSF 78 (*)    Lymphs, CSF 94 (*)    Monocyte-Macrophage-Spinal Fluid 6 (*)    All other components within normal limits  PROTEIN, CSF - Abnormal; Notable for the following:    Total  Protein, CSF 110 (*)    All other components within  normal limits  CSF CELL COUNT WITH DIFFERENTIAL - Abnormal; Notable for the following:    Appearance, CSF HAZY (*)    RBC Count, CSF 6 (*)    WBC, CSF 56 (*)    Lymphs, CSF 85 (*)    Monocyte-Macrophage-Spinal Fluid 10 (*)    All other components within normal limits  GRAM STAIN  CULTURE, BLOOD (ROUTINE X 2)  CULTURE, BLOOD (ROUTINE X 2)  CSF CULTURE  CBC WITH DIFFERENTIAL  GLUCOSE, CSF   Ct Head Wo Contrast  11/23/2012  *RADIOLOGY REPORT*  Clinical Data: 37 year old female with headache times 3 days.  CT HEAD WITHOUT CONTRAST  Technique:  Contiguous axial images were obtained from the base of the skull through the vertex without contrast.  Comparison: 10/02/2011 and earlier.  Findings: Interval improved paranasal sinus aeration.  Small fluid levels in the sphenoid sinuses.  Residual mucosal thickening or mucous retention cyst at the left frontal recess.  Mastoids are clear.  Visualized orbits and scalp soft tissues are within normal limits. No acute osseous abnormality identified.  Stable and normal cerebral volume.  No midline shift, ventriculomegaly, mass effect, evidence of mass lesion, intracranial hemorrhage or evidence of cortically based acute infarction.  Gray-white matter differentiation is within normal limits throughout the brain.  No suspicious  intracranial vascular hyperdensity.  IMPRESSION: 1.  Stable and normal noncontrast CT appearance of the brain. 2.  Interval decreased paranasal sinus inflammatory changes.   Original Report Authenticated By: Erskine Speed, M.D.      1. Meningitis   2. Headache   3. Stiff neck    Medications  cefTRIAXone (ROCEPHIN) 2 g in dextrose 5 % 50 mL IVPB (not administered)  acyclovir (ZOVIRAX) 650 mg in dextrose 5 % 100 mL IVPB (650 mg Intravenous New Bag/Given 11/23/12 0752)  ketorolac (TORADOL) 30 MG/ML injection 30 mg (30 mg Intravenous Given 11/23/12 0156)  0.9 %  sodium chloride infusion ( Intravenous New Bag/Given 11/23/12 0603)  sodium chloride  0.9 % bolus 1,000 mL (0 mLs Intravenous Stopped 11/23/12 0426)  HYDROmorphone (DILAUDID) injection 0.5 mg (0.5 mg Intravenous Given 11/23/12 0155)  ondansetron (ZOFRAN) injection 4 mg (4 mg Intravenous Given 11/23/12 0153)  vancomycin (VANCOCIN) IVPB 1000 mg/200 mL premix (0 mg Intravenous Stopped 11/23/12 0709)  dexamethasone (DECADRON) injection 10 mg (10 mg Intravenous Given 11/23/12 0606)    MDM  Mackinsey Pelland is a 37 y.o. female presenting with headache and stiff neck, she says this feels like her prior meningitis. Patient's initial labs are unremarkable, however her spinal tap reveals leukocytosis in the CSF which are predominantly lymphocytes. No organisms are seen. This is likely another case of viral meningitis, however the patient is very uncomfortable and will admit the patient pending culture results. Patient has been treated with vancomycin, ceftriaxone with Decadron and will also be given acyclovir. Patient will be admitted stable. No septic physiology   Discussed with Dr. Fara Boros for admission from family practice service  Jones Skene, MD 11/23/12 [phone]

## 2012-11-23 NOTE — ED Notes (Signed)
Called report to 4 north bed 8 to [place], [employer]

## 2012-11-23 NOTE — ED Notes (Signed)
Report from [place], [place].  Vancomycin started at 709, Called pharmacy to clarify vancomycin and Zovirax together. ?They are compatible.

## 2012-11-23 NOTE — ED Notes (Addendum)
Critical value Alert. RN notified MD.  CSF tube 1 white count-  78 CSF tube 4 white count-  56

## 2012-11-23 NOTE — Consult Note (Signed)
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 Stop doxy Check ehrlichia, rmsf. Lyme is not endemic to this area. Check HIV RNA on serum Check HSV on CSF Check CSF AFB Cx Check serum quantiferon gold Check CD4, check IgG/M/A/E levels Check complement levels.  Check ANA  Comment- recurrent meningitis is unusual and can be associated with CSF leaks (she has no hx of trauma and no documentation of rhinorrhea). It can be associated with immunodeficiency syndromes (classically complement deficiency). It is most commonly caused by recurrent HSV ( mollaret's syndrome). Autoimmune syndromes (SLE, vasculitides) can cause this syndrome as well.   Thank you so much for this interesting consult,   Johny Sax [phone]  Brittney [name] is an 37 y.o. female.  HPI: 37 yo [place] F (in [place] since 2000) with her third episode of headaches, neck pain and ? Meningitis. She denies fevers prior to hospitalization. She denies sick exposures though she does have small children at home. She denies TB exposures. She denies photophobia. She denies recurrent infections as a child.  I am not sure about her english language capacity.   Past Medical History  Diagnosis Date  . Meningitis 2010, 2007    Past Surgical History  Procedure Laterality Date  . Cesarean section       No Known Allergies  Medications:  Scheduled: . acyclovir  10 mg/kg Intravenous Q8H  . cefTRIAXone (ROCEPHIN)  IV  2 g Intravenous Q12H  . doxycycline (VIBRAMYCIN) IV  100 mg Intravenous Q12H  . enoxaparin (LOVENOX) injection  40 mg Subcutaneous Q24H    Total days of antibiotics: 1          Social History:  reports that she has never smoked. She has never used smokeless tobacco. She reports that she does not  drink alcohol or use illicit drugs.  History reviewed. No pertinent family history.  General ROS: see HPI. no dysphagia, no oral ulcers. pt becomes tearful during exam. no diarrhea. no hx of insect bites. no hx of rash  Blood pressure 112/71, pulse 71, temperature 98.4 F (36.9 C), temperature source Oral, resp. rate 18, height 5' 6.14" (1.68 m), weight 65.8 kg (145 lb 1 oz), last menstrual period 11/16/2012, SpO2 97.00%. General appearance: alert, cooperative and no distress Eyes: negative findings: conjunctivae and sclerae normal, visual fields full to confrontation and no photophobia Throat: lips, mucosa, and tongue normal; teeth and gums normal Neck: no adenopathy, supple, symmetrical, trachea midline and mild-moderate resistance to flexion. Lungs: clear to auscultation bilaterally Heart: regular rate and rhythm Abdomen: normal findings: bowel sounds normal and soft, non-tender Skin: Skin color, texture, turgor normal. No rashes or lesions Neurologic: Grossly normal- she has normal grip strength, coordination.    Results for orders placed during the hospital encounter of 11/23/12 (from the past 48 hour(s))  CBC WITH DIFFERENTIAL     Status: None   Collection Time    11/23/12 12:41 AM      Result Value Range   WBC 9.2  4.0 - 10.5 K/uL   RBC 4.28  3.87 - 5.11 MIL/uL   Hemoglobin 13.2  12.0 - 15.0 g/dL   HCT 21.3  08.6 - 57.8 %   MCV 89.3  78.0 - 100.0 fL   MCH 30.8  26.0 - 34.0 pg   MCHC 34.6  30.0 -  36.0 g/dL   RDW 08.6  57.8 - 46.9 %   Platelets 229  150 - 400 K/uL   Neutrophils Relative 74  43 - 77 %   Neutro Abs 6.9  1.7 - 7.7 K/uL   Lymphocytes Relative 20  12 - 46 %   Lymphs Abs 1.8  0.7 - 4.0 K/uL   Monocytes Relative 5  3 - 12 %   Monocytes Absolute 0.4  0.1 - 1.0 K/uL   Eosinophils Relative 1  0 - 5 %   Eosinophils Absolute 0.1  0.0 - 0.7 K/uL   Basophils Relative 0  0 - 1 %   Basophils Absolute 0.0  0.0 - 0.1 K/uL  BASIC METABOLIC PANEL     Status: Abnormal    Collection Time    11/23/12 12:41 AM      Result Value Range   Sodium 137  135 - 145 mEq/L   Potassium 3.6  3.5 - 5.1 mEq/L   Chloride 103  96 - 112 mEq/L   CO2 24  19 - 32 mEq/L   Glucose, Bld 105 (*) 70 - 99 mg/dL   BUN 14  6 - 23 mg/dL   Creatinine, Ser 6.29  0.50 - 1.10 mg/dL   Calcium 9.2  8.4 - 52.8 mg/dL   GFR calc non Af Amer >90  >90 mL/min   GFR calc Af Amer >90  >90 mL/min   Comment:            The eGFR has been calculated     using the CKD EPI equation.     This calculation has not been     validated in all clinical     situations.     eGFR's persistently     <90 mL/min signify     possible Chronic Kidney Disease.  GRAM STAIN     Status: None   Collection Time    11/23/12  4:20 AM      Result Value Range   Specimen Description CSF     Special Requests Normal     Gram Stain       Value: [id number]     WBC PRESENT, PREDOMINANTLY PMN     NO ORGANISMS SEEN   Report Status 11/23/2012 FINAL    CSF CULTURE     Status: None   Collection Time    11/23/12  4:22 AM      Result Value Range   Specimen Description CSF     Special Requests Normal     Gram Stain       Value: WBC PRESENT, PREDOMINANTLY MONONUCLEAR     NO ORGANISMS SEEN   Culture PENDING     Report Status PENDING    CSF CELL COUNT WITH DIFFERENTIAL     Status: Abnormal   Collection Time    11/23/12  4:23 AM      Result Value Range   Tube # 1     Color, CSF COLORLESS  COLORLESS   Appearance, CSF HAZY (*) CLEAR   Supernatant NOT INDICATED     RBC Count, CSF 144 (*) 0 /cu mm   WBC, CSF 78 (*) 0 - 5 /cu mm   Comment: CRITICAL RESULT CALLED TO, READ BACK BY AND VERIFIED WITH:     J.CAMP,RN 0535 11/23/12 M.CAMPBELL   Segmented Neutrophils-CSF 0  0 - 6 %   Lymphs, CSF 94 (*) 40 - 80 %   Monocyte-Macrophage-Spinal Fluid 6 (*) 15 - 45 %  Eosinophils, CSF 0  0 - 1 %  CSF CELL COUNT WITH DIFFERENTIAL     Status: Abnormal   Collection Time    11/23/12  4:23 AM      Result Value Range   Tube # 4      Color, CSF COLORLESS  COLORLESS   Appearance, CSF HAZY (*) CLEAR   Supernatant NOT INDICATED     RBC Count, CSF 6 (*) 0 /cu mm   WBC, CSF 56 (*) 0 - 5 /cu mm   Comment: CRITICAL RESULT CALLED TO, READ BACK BY AND VERIFIED WITH:     J.CAMP,RN 0535 11/23/12 M.CAMPBELL   Segmented Neutrophils-CSF 5  0 - 6 %   Lymphs, CSF 85 (*) 40 - 80 %   Monocyte-Macrophage-Spinal Fluid 10 (*) 15 - 45 %   Eosinophils, CSF 0  0 - 1 %  PATHOLOGIST SMEAR REVIEW     Status: None   Collection Time    11/23/12  4:23 AM      Result Value Range   Path Review       Value: Lymphocytosis. Please refer to cell count and correlate clinically.   Comment: Reviewed by Elana Alm. Hillard, MD     11/23/12  GLUCOSE, CSF     Status: None   Collection Time    11/23/12  4:27 AM      Result Value Range   Glucose, CSF 50  43 - 76 mg/dL  PROTEIN, CSF     Status: Abnormal   Collection Time    11/23/12  4:27 AM      Result Value Range   Total  Protein, CSF 110 (*) 15 - 45 mg/dL  CBC     Status: None   Collection Time    11/23/12 10:00 AM      Result Value Range   WBC 8.0  4.0 - 10.5 K/uL   RBC 4.08  3.87 - 5.11 MIL/uL   Hemoglobin 12.7  12.0 - 15.0 g/dL   HCT 16.1  09.6 - 04.5 %   MCV 88.2  78.0 - 100.0 fL   MCH 31.1  26.0 - 34.0 pg   MCHC 35.3  30.0 - 36.0 g/dL   RDW 40.9  81.1 - 91.4 %   Platelets 230  150 - 400 K/uL  CREATININE, SERUM     Status: None   Collection Time    11/23/12 10:00 AM      Result Value Range   Creatinine, Ser 0.64  0.50 - 1.10 mg/dL   GFR calc non Af Amer >90  >90 mL/min   GFR calc Af Amer >90  >90 mL/min   Comment:            The eGFR has been calculated     using the CKD EPI equation.     This calculation has not been     validated in all clinical     situations.     eGFR's persistently     <90 mL/min signify     possible Chronic Kidney Disease.  HIV ANTIBODY (ROUTINE TESTING)     Status: None   Collection Time    11/23/12 10:00 AM      Result Value Range   HIV NON REACTIVE   NON REACTIVE  RPR     Status: None   Collection Time    11/23/12 10:00 AM      Result Value Range   RPR NON REACTIVE  NON REACTIVE  Component Value Date/Time   SDES CSF 11/23/2012 0422   SPECREQUEST Normal 11/23/2012 0422   CULT PENDING 11/23/2012 0422   REPTSTATUS PENDING 11/23/2012 0422   Ct Head Wo Contrast  11/23/2012  *RADIOLOGY REPORT*  Clinical Data: 37 year old female with headache times 3 days.  CT HEAD WITHOUT CONTRAST  Technique:  Contiguous axial images were obtained from the base of the skull through the vertex without contrast.  Comparison: 10/02/2011 and earlier.  Findings: Interval improved paranasal sinus aeration.  Small fluid levels in the sphenoid sinuses.  Residual mucosal thickening or mucous retention cyst at the left frontal recess.  Mastoids are clear.  Visualized orbits and scalp soft tissues are within normal limits. No acute osseous abnormality identified.  Stable and normal cerebral volume.  No midline shift, ventriculomegaly, mass effect, evidence of mass lesion, intracranial hemorrhage or evidence of cortically based acute infarction.  Gray-white matter differentiation is within normal limits throughout the brain.  No suspicious intracranial vascular hyperdensity.  IMPRESSION: 1.  Stable and normal noncontrast CT appearance of the brain. 2.  Interval decreased paranasal sinus inflammatory changes.   Original Report Authenticated By: Erskine Speed, M.D.    Recent Results (from the past 240 hour(s))  GRAM STAIN     Status: None   Collection Time    11/23/12  4:20 AM      Result Value Range Status   Specimen Description CSF   Final   Special Requests Normal   Final   Gram Stain     Final   Value: [id number]     WBC PRESENT, PREDOMINANTLY PMN     NO ORGANISMS SEEN   Report Status 11/23/2012 FINAL   Final  CSF CULTURE     Status: None   Collection Time    11/23/12  4:22 AM      Result Value Range Status   Specimen Description CSF   Final   Special Requests Normal    Final   Gram Stain     Final   Value: WBC PRESENT, PREDOMINANTLY MONONUCLEAR     NO ORGANISMS SEEN   Culture PENDING   Incomplete   Report Status PENDING   Incomplete      11/23/2012, 4:08 PM     LOS: 0 days

## 2012-11-23 NOTE — ED Notes (Signed)
Pt. Is on droplet percautions. IVF at [facility].

## 2012-11-23 NOTE — ED Notes (Signed)
Phlebotomy at bedside for blood cultures.  

## 2012-11-23 NOTE — ED Notes (Signed)
Primary RN unable to get access. Secondary RN unable to get access. IV team at bedside.

## 2012-11-23 NOTE — ED Notes (Signed)
Pt ambulated to restroom. 

## 2012-11-23 NOTE — ED Notes (Signed)
RN placed lumbar tray at bedside

## 2012-11-23 NOTE — ED Notes (Signed)
CT paged. 

## 2012-11-24 DIAGNOSIS — M436 Torticollis: Secondary | ICD-10-CM

## 2012-11-24 DIAGNOSIS — R51 Headache: Secondary | ICD-10-CM

## 2012-11-24 LAB — BASIC METABOLIC PANEL
BUN: 12 mg/dL (ref 6–23)
CO2: 21 mEq/L (ref 19–32)
Glucose, Bld: 107 mg/dL — ABNORMAL HIGH (ref 70–99)
Potassium: 3 mEq/L — ABNORMAL LOW (ref 3.5–5.1)
Sodium: 140 mEq/L (ref 135–145)

## 2012-11-24 LAB — CBC
HCT: 33 % — ABNORMAL LOW (ref 36.0–46.0)
Hemoglobin: 11.5 g/dL — ABNORMAL LOW (ref 12.0–15.0)
MCH: 30.3 pg (ref 26.0–34.0)
MCHC: 34.8 g/dL (ref 30.0–36.0)
MCV: 86.8 fL (ref 78.0–100.0)
RBC: 3.8 MIL/uL — ABNORMAL LOW (ref 3.87–5.11)

## 2012-11-24 LAB — ROCKY MTN SPOTTED FVR AB, IGG-BLOOD: RMSF IgG: 0.12 IV

## 2012-11-24 MED ORDER — POTASSIUM CHLORIDE CRYS ER 20 MEQ PO TBCR
40.0000 meq | EXTENDED_RELEASE_TABLET | Freq: Two times a day (BID) | ORAL | Status: AC
  Administered 2012-11-24 – 2012-11-25 (×3): 40 meq via ORAL
  Filled 2012-11-24 (×3): qty 2

## 2012-11-24 NOTE — Progress Notes (Signed)
FMTS Attending Admission Note: Brittney Mcauley,MD I  have seen and examined this patient, reviewed their chart. I have discussed this patient with the resident. I agree with the resident's findings, assessment and care plan.  Patient continues to improve on treatment,denies any headache today.Physical exam normal. I agree with continuing A/B pending culture report.

## 2012-11-24 NOTE — Progress Notes (Signed)
Family Medicine Teaching Service Daily Progress Note Service Page: [phone]  Subjective: doing well, feels good, no pain  Objective: Temp:  [97.9 F (36.6 C)-98.5 F (36.9 C)] 98.5 F (36.9 C) (05/08 0543) Pulse Rate:  [55-78] 78 (05/08 0543) Resp:  [18-20] 18 (05/08 0543) BP: (97-112)/(52-71) 100/58 mmHg (05/08 0543) SpO2:  [97 %-100 %] 97 % (05/08 0543) Weight:  [145 lb 1 oz (65.8 kg)] 145 lb 1 oz (65.8 kg) (05/07 0900) Exam: General: NAD, resting comfortably Cardiovascular: rrr, no mrg Respiratory: CTAB, no wheezes or crackles Extremities: no edema Neuro: 5/5 strength throughout, PERRLA, sensation to light touch intact  CBC BMET   Recent Labs Lab 11/23/12 0041 11/23/12 1000 11/24/12 0540  WBC 9.2 8.0 9.3  HGB 13.2 12.7 11.5*  HCT 38.2 36.0 33.0*  PLT 229 230 222    Recent Labs Lab 11/23/12 0041 11/23/12 1000 11/24/12 0540  NA 137  --  140  K 3.6  --  3.0*  CL 103  --  110  CO2 24  --  21  BUN 14  --  12  CREATININE 0.68 0.64 0.58  GLUCOSE 105*  --  107*  CALCIUM 9.2  --  8.1*     Results for orders placed during the hospital encounter of 11/23/12 (from the past 24 hour(s))  CBC     Status: None   Collection Time    11/23/12 10:00 AM      Result Value Range   WBC 8.0  4.0 - 10.5 K/uL   RBC 4.08  3.87 - 5.11 MIL/uL   Hemoglobin 12.7  12.0 - 15.0 g/dL   HCT 45.4  09.8 - 11.9 %   MCV 88.2  78.0 - 100.0 fL   MCH 31.1  26.0 - 34.0 pg   MCHC 35.3  30.0 - 36.0 g/dL   RDW 14.7  82.9 - 56.2 %   Platelets 230  150 - 400 K/uL  CREATININE, SERUM     Status: None   Collection Time    11/23/12 10:00 AM      Result Value Range   Creatinine, Ser 0.64  0.50 - 1.10 mg/dL   GFR calc non Af Amer >90  >90 mL/min   GFR calc Af Amer >90  >90 mL/min  HIV ANTIBODY (ROUTINE TESTING)     Status: None   Collection Time    11/23/12 10:00 AM      Result Value Range   HIV NON REACTIVE  NON REACTIVE  RPR     Status: None   Collection Time    11/23/12 10:00 AM       Result Value Range   RPR NON REACTIVE  NON REACTIVE  BASIC METABOLIC PANEL     Status: Abnormal   Collection Time    11/24/12  5:40 AM      Result Value Range   Sodium 140  135 - 145 mEq/L   Potassium 3.0 (*) 3.5 - 5.1 mEq/L   Chloride 110  96 - 112 mEq/L   CO2 21  19 - 32 mEq/L   Glucose, Bld 107 (*) 70 - 99 mg/dL   BUN 12  6 - 23 mg/dL   Creatinine, Ser 1.30  0.50 - 1.10 mg/dL   Calcium 8.1 (*) 8.4 - 10.5 mg/dL   GFR calc non Af Amer >90  >90 mL/min   GFR calc Af Amer >90  >90 mL/min  CBC     Status: Abnormal   Collection Time  11/24/12  5:40 AM      Result Value Range   WBC 9.3  4.0 - 10.5 K/uL   RBC 3.80 (*) 3.87 - 5.11 MIL/uL   Hemoglobin 11.5 (*) 12.0 - 15.0 g/dL   HCT 16.1 (*) 09.6 - 04.5 %   MCV 86.8  78.0 - 100.0 fL   MCH 30.3  26.0 - 34.0 pg   MCHC 34.8  30.0 - 36.0 g/dL   RDW 40.9  81.1 - 91.4 %   Platelets 222  150 - 400 K/uL   Imaging/Diagnostic Tests: Ct Head Wo Contrast  11/23/2012  *RADIOLOGY REPORT*  Clinical Data: 37 year old female with headache times 3 days.  CT HEAD WITHOUT CONTRAST  Technique:  Contiguous axial images were obtained from the base of the skull through the vertex without contrast.  Comparison: 10/02/2011 and earlier.  Findings: Interval improved paranasal sinus aeration.  Small fluid levels in the sphenoid sinuses.  Residual mucosal thickening or mucous retention cyst at the left frontal recess.  Mastoids are clear.  Visualized orbits and scalp soft tissues are within normal limits. No acute osseous abnormality identified.  Stable and normal cerebral volume.  No midline shift, ventriculomegaly, mass effect, evidence of mass lesion, intracranial hemorrhage or evidence of cortically based acute infarction.  Gray-white matter differentiation is within normal limits throughout the brain.  No suspicious intracranial vascular hyperdensity.  IMPRESSION: 1.  Stable and normal noncontrast CT appearance of the brain. 2.  Interval decreased paranasal sinus  inflammatory changes.   Original Report Authenticated By: Erskine Speed, M.D.      Assessment/Plan: 37 yo [name] with history of aseptic meningitis presents with headache and neck stiffness x3 days.   1. Headache and neck stiffness: likely aseptic or viral meningitis (based on CSF results), ddx includes bacterial vs fungal vs Lyme disease vs RMSF vs Syphilis vs drug-induced vs other. At this time appears to be related to viral or inflammatory process with normal glucose and elevated protein, no organisms on CSF gram stain -isolation precautions -S/p Vanc, CTX, acyclovir and dexamethasone in ED  -Will treat with: CTX amd Acyclovir (HSV)  -Blood and CSF cultures pending  -Will check CSF for AFB, HSV -Check blood for ehrlichia, quantiferon gold, CD4, IgG/M/A/E, C3, C4, ANA  -Will treat pain with PRN tylenol, Toradol, and Morphine  -ID c/s appreciate Dr. Moshe Cipro help in managing this patient-will order additional labs today   FEN/GI: NS @ 100cc/hr, regular diet  Ppx: Lovenox SQ  Dispo: D/c pending clinical improvement, r/o bacterial meningitis-potentially home once CSF culture negative with ID follow-up given improvement in symptoms    Marikay Alar, MD 11/24/2012, 8:55 AM

## 2012-11-24 NOTE — Progress Notes (Signed)
INFECTIOUS DISEASE PROGRESS NOTE  ID: Brittney [name] is a 37 y.o. female with   Principal Problem:   MENINGITIS  Subjective: Without complaints, headache better.   Abtx:  Anti-infectives   Start     Dose/Rate Route Frequency Ordered Stop   11/23/12 1400  acyclovir (ZOVIRAX) 660 mg in dextrose 5 % 100 mL IVPB     10 mg/kg  65.8 kg 113.2 mL/hr over 60 Minutes Intravenous 3 times per day 11/23/12 0940     11/23/12 1200  doxycycline (VIBRAMYCIN) 100 mg in dextrose 5 % 250 mL IVPB  Status:  Discontinued     100 mg 125 mL/hr over 120 Minutes Intravenous Every 12 hours 11/23/12 0940 11/23/12 1824   11/23/12 1000  cefTRIAXone (ROCEPHIN) 2 g in dextrose 5 % 50 mL IVPB     2 g 100 mL/hr over 30 Minutes Intravenous Every 12 hours 11/23/12 0940     11/23/12 0645  acyclovir (ZOVIRAX) 650 mg in dextrose 5 % 100 mL IVPB     650 mg 113 mL/hr over 60 Minutes Intravenous  Once 11/23/12 0632 11/23/12 0858   11/23/12 0545  vancomycin (VANCOCIN) IVPB 1000 mg/200 mL premix     1,000 mg 200 mL/hr over 60 Minutes Intravenous  Once 11/23/12 0539 11/23/12 0709   11/23/12 0545  cefTRIAXone (ROCEPHIN) 2 g in dextrose 5 % 50 mL IVPB     2 g 100 mL/hr over 30 Minutes Intravenous  Once 11/23/12 0539 11/23/12 0858      Medications:  Scheduled: . acyclovir  10 mg/kg Intravenous Q8H  . cefTRIAXone (ROCEPHIN)  IV  2 g Intravenous Q12H  . enoxaparin (LOVENOX) injection  40 mg Subcutaneous Q24H  . potassium chloride  40 mEq Oral BID    Objective: Vital signs in last 24 hours: Temp:  [97.6 F (36.4 C)-98.5 F (36.9 C)] 97.6 F (36.4 C) (05/08 1000) Pulse Rate:  [55-78] 55 (05/08 1000) Resp:  [18-20] 18 (05/08 1000) BP: (97-112)/(52-71) 106/59 mmHg (05/08 1000) SpO2:  [97 %-100 %] 100 % (05/08 1000)   General appearance: alert, cooperative and no distress Neck: no tenderness Resp: clear to auscultation bilaterally Cardio: regular rate and rhythm GI: normal findings: bowel sounds normal and soft,  non-tender  Lab Results  Recent Labs  11/23/12 0041 11/23/12 1000 11/24/12 0540  WBC 9.2 8.0 9.3  HGB 13.2 12.7 11.5*  HCT 38.2 36.0 33.0*  NA 137  --  140  K 3.6  --  3.0*  CL 103  --  110  CO2 24  --  21  BUN 14  --  12  CREATININE 0.68 0.64 0.58   Liver Panel No results found for this basename: PROT, ALBUMIN, AST, ALT, ALKPHOS, BILITOT, BILIDIR, IBILI,  in the last 72 hours Sedimentation Rate No results found for this basename: ESRSEDRATE,  in the last 72 hours C-Reactive Protein No results found for this basename: CRP,  in the last 72 hours  Microbiology: Recent Results (from the past 240 hour(s))  CULTURE, BLOOD (ROUTINE X 2)     Status: None   Collection Time    11/23/12 12:40 AM      Result Value Range Status   Specimen Description BLOOD RIGHT ARM   Final   Special Requests BOTTLES DRAWN AEROBIC AND ANAEROBIC 10CC   Final   Culture  Setup Time 11/23/2012 09:54   Final   Culture     Final   Value:  BLOOD CULTURE RECEIVED NO GROWTH TO DATE CULTURE WILL BE HELD FOR 5 DAYS BEFORE ISSUING A FINAL NEGATIVE REPORT   Report Status PENDING   Incomplete  CULTURE, BLOOD (ROUTINE X 2)     Status: None   Collection Time    11/23/12 12:45 AM      Result Value Range Status   Specimen Description BLOOD LEFT ARM   Final   Special Requests BOTTLES DRAWN AEROBIC ONLY 10CC   Final   Culture  Setup Time 11/23/2012 09:55   Final   Culture     Final   Value:        BLOOD CULTURE RECEIVED NO GROWTH TO DATE CULTURE WILL BE HELD FOR 5 DAYS BEFORE ISSUING A FINAL NEGATIVE REPORT   Report Status PENDING   Incomplete  GRAM STAIN     Status: None   Collection Time    11/23/12  4:20 AM      Result Value Range Status   Specimen Description CSF   Final   Special Requests Normal   Final   Gram Stain     Final   Value: [id number]     WBC PRESENT, PREDOMINANTLY PMN     NO ORGANISMS SEEN   Report Status 11/23/2012 FINAL   Final  CSF CULTURE     Status: None   Collection Time     11/23/12  4:22 AM      Result Value Range Status   Specimen Description CSF   Final   Special Requests Normal   Final   Gram Stain     Final   Value: WBC PRESENT, PREDOMINANTLY MONONUCLEAR     NO ORGANISMS SEEN   Culture NO GROWTH 1 DAY   Final   Report Status PENDING   Incomplete    Studies/Results: Ct Head Wo Contrast  11/23/2012  *RADIOLOGY REPORT*  Clinical Data: 37 year old female with headache times 3 days.  CT HEAD WITHOUT CONTRAST  Technique:  Contiguous axial images were obtained from the base of the skull through the vertex without contrast.  Comparison: 10/02/2011 and earlier.  Findings: Interval improved paranasal sinus aeration.  Small fluid levels in the sphenoid sinuses.  Residual mucosal thickening or mucous retention cyst at the left frontal recess.  Mastoids are clear.  Visualized orbits and scalp soft tissues are within normal limits. No acute osseous abnormality identified.  Stable and normal cerebral volume.  No midline shift, ventriculomegaly, mass effect, evidence of mass lesion, intracranial hemorrhage or evidence of cortically based acute infarction.  Gray-white matter differentiation is within normal limits throughout the brain.  No suspicious intracranial vascular hyperdensity.  IMPRESSION: 1.  Stable and normal noncontrast CT appearance of the brain. 2.  Interval decreased paranasal sinus inflammatory changes.   Original Report Authenticated By: Erskine Speed, M.D.      Assessment/Plan: Aseptic Meningitis  Recurrent CSF Cx and BCx are ngtd. multiple serologies pending can stop isolation after 48h of anbx.   Total days of antibiotics: 2 (acyclovir, ceftriaxone) Negative: HIV Ab, RPR, Crypto Ag (CSF)         Johny Sax Infectious Diseases [phone] 11/24/2012, 10:54 AM   LOS: 1 day

## 2012-11-25 DIAGNOSIS — R030 Elevated blood-pressure reading, without diagnosis of hypertension: Secondary | ICD-10-CM

## 2012-11-25 LAB — CBC
HCT: 34.8 % — ABNORMAL LOW (ref 36.0–46.0)
Platelets: 216 10*3/uL (ref 150–400)
RBC: 3.8 MIL/uL — ABNORMAL LOW (ref 3.87–5.11)
RDW: 13.1 % (ref 11.5–15.5)
WBC: 5.1 10*3/uL (ref 4.0–10.5)

## 2012-11-25 LAB — BASIC METABOLIC PANEL
Chloride: 110 mEq/L (ref 96–112)
Creatinine, Ser: 0.63 mg/dL (ref 0.50–1.10)
GFR calc Af Amer: >90 mL/min (ref >90)
Sodium: 143 mEq/L (ref 135–145)

## 2012-11-25 LAB — QUANTIFERON TB GOLD ASSAY (BLOOD)
Interferon Gamma Release Assay: NEGATIVE
Mitogen value: 8.61 IU/mL
Quantiferon Nil Value: 0.03 IU/mL
TB Ag value: 0.06 IU/mL
TB Antigen Minus Nil Value: 0.03 IU/mL

## 2012-11-25 LAB — IGG, IGA, IGM: IgA: 85 mg/dL (ref 69–380)

## 2012-11-25 LAB — C3 COMPLEMENT: C3 Complement: 113 mg/dL (ref 90–180)

## 2012-11-25 LAB — EHRLICHIA ANTIBODY PANEL: E chaffeensis (HGE) Ab, IgG: NEGATIVE

## 2012-11-25 LAB — C4 COMPLEMENT: Complement C4, Body Fluid: 27 mg/dL (ref 10–40)

## 2012-11-25 MED ORDER — VALACYCLOVIR HCL 1 G PO TABS: 1000.0000 mg | ORAL_TABLET | Freq: Three times a day (TID) | ORAL | Status: DC

## 2012-11-25 NOTE — Progress Notes (Signed)
FMTS Attending Admission Note: Kehinde Eniola,MD I  have seen and examined this patient, reviewed their chart. I have discussed this patient with the resident. I agree with the resident's findings, assessment and care plan.  

## 2012-11-25 NOTE — Progress Notes (Signed)
Pt was given discharge orders with follow up care and Meds to be picked up at her Pharmacy , IV taken out and I called DR.Eniola about discharge and everything was in order

## 2012-11-25 NOTE — Progress Notes (Addendum)
INFECTIOUS DISEASE PROGRESS NOTE  ID: Brittney [name] is a 37 y.o. female with   Principal Problem:   MENINGITIS  Subjective: Headache better.   Abtx:  Anti-infectives   Start     Dose/Rate Route Frequency Ordered Stop   11/23/12 1400  acyclovir (ZOVIRAX) 660 mg in dextrose 5 % 100 mL IVPB     10 mg/kg  65.8 kg 113.2 mL/hr over 60 Minutes Intravenous 3 times per day 11/23/12 0940     11/23/12 1200  doxycycline (VIBRAMYCIN) 100 mg in dextrose 5 % 250 mL IVPB  Status:  Discontinued     100 mg 125 mL/hr over 120 Minutes Intravenous Every 12 hours 11/23/12 0940 11/23/12 1824   11/23/12 1000  cefTRIAXone (ROCEPHIN) 2 g in dextrose 5 % 50 mL IVPB  Status:  Discontinued     2 g 100 mL/hr over 30 Minutes Intravenous Every 12 hours 11/23/12 0940 11/25/12 0952   11/23/12 0645  acyclovir (ZOVIRAX) 650 mg in dextrose 5 % 100 mL IVPB     650 mg 113 mL/hr over 60 Minutes Intravenous  Once 11/23/12 4540 11/23/12 0858   11/23/12 0545  vancomycin (VANCOCIN) IVPB 1000 mg/200 mL premix     1,000 mg 200 mL/hr over 60 Minutes Intravenous  Once 11/23/12 0539 11/23/12 0709   11/23/12 0545  cefTRIAXone (ROCEPHIN) 2 g in dextrose 5 % 50 mL IVPB     2 g 100 mL/hr over 30 Minutes Intravenous  Once 11/23/12 0539 11/23/12 0858      Medications:  Scheduled: . acyclovir  10 mg/kg Intravenous Q8H  . enoxaparin (LOVENOX) injection  40 mg Subcutaneous Q24H    Objective: Vital signs in last 24 hours: Temp:  [97.9 F (36.6 C)-98.4 F (36.9 C)] 98 F (36.7 C) (05/09 0551) Pulse Rate:  [55-66] 58 (05/09 0659) Resp:  [16-18] 16 (05/09 0551) BP: (86-111)/(44-66) 90/62 mmHg (05/09 0659) SpO2:  [97 %-100 %] 97 % (05/09 0551)   General appearance: alert, cooperative and no distress Neck: FROM, non-tender Resp: clear to auscultation bilaterally Cardio: regular rate and rhythm GI: normal findings: bowel sounds normal and soft, non-tender  Lab Results  Recent Labs  11/24/12 0540 11/25/12 0900  WBC  9.3 5.1  HGB 11.5* 11.6*  HCT 33.0* 34.8*  NA 140 143  K 3.0* 3.6  CL 110 110  CO2 21 27  BUN 12 9  CREATININE 0.58 0.63   Liver Panel No results found for this basename: PROT, ALBUMIN, AST, ALT, ALKPHOS, BILITOT, BILIDIR, IBILI,  in the last 72 hours Sedimentation Rate No results found for this basename: ESRSEDRATE,  in the last 72 hours C-Reactive Protein No results found for this basename: CRP,  in the last 72 hours  Microbiology: Recent Results (from the past 240 hour(s))  CULTURE, BLOOD (ROUTINE X 2)     Status: None   Collection Time    11/23/12 12:40 AM      Result Value Range Status   Specimen Description BLOOD RIGHT ARM   Final   Special Requests BOTTLES DRAWN AEROBIC AND ANAEROBIC 10CC   Final   Culture  Setup Time 11/23/2012 09:54   Final   Culture     Final   Value:        BLOOD CULTURE RECEIVED NO GROWTH TO DATE CULTURE WILL BE HELD FOR 5 DAYS BEFORE ISSUING A FINAL NEGATIVE REPORT   Report Status PENDING   Incomplete  CULTURE, BLOOD (ROUTINE X 2)     Status:  None   Collection Time    11/23/12 12:45 AM      Result Value Range Status   Specimen Description BLOOD LEFT ARM   Final   Special Requests BOTTLES DRAWN AEROBIC ONLY 10CC   Final   Culture  Setup Time 11/23/2012 09:55   Final   Culture     Final   Value:        BLOOD CULTURE RECEIVED NO GROWTH TO DATE CULTURE WILL BE HELD FOR 5 DAYS BEFORE ISSUING A FINAL NEGATIVE REPORT   Report Status PENDING   Incomplete  GRAM STAIN     Status: None   Collection Time    11/23/12  4:20 AM      Result Value Range Status   Specimen Description CSF   Final   Special Requests Normal   Final   Gram Stain     Final   Value: CYTOSPUN     WBC PRESENT, PREDOMINANTLY PMN     NO ORGANISMS SEEN   Report Status 11/23/2012 FINAL   Final  CSF CULTURE     Status: None   Collection Time    11/23/12  4:22 AM      Result Value Range Status   Specimen Description CSF   Final   Special Requests Normal   Final   Gram Stain      Final   Value: WBC PRESENT, PREDOMINANTLY MONONUCLEAR     NO ORGANISMS SEEN   Culture NO GROWTH 1 DAY   Final   Report Status PENDING   Incomplete  AFB CULTURE WITH SMEAR     Status: None   Collection Time    11/23/12  4:23 AM      Result Value Range Status   Specimen Description CSF   Final   Special Requests Normal   Final   ACID FAST SMEAR NOT DONE   Final   Culture     Final   Value: CULTURE WILL BE EXAMINED FOR 6 WEEKS BEFORE ISSUING A FINAL REPORT   Report Status PENDING   Incomplete    Studies/Results: No results found.   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  Would check HIV RNA (serum) as well She will f/u in [facility] clinic.  Agree with sending her home on vatlrex to total 14 days of rx.           Brittney [name] Infectious Diseases [phone] 11/25/2012, 11:18 AM   LOS: 2 days   Addendum- Spoke with Lab, there is not enough CSF to run the HSV PCR and arbovirus panel. Would treat empirically with valtrex.

## 2012-11-25 NOTE — Progress Notes (Signed)
Family Medicine Teaching Service Daily Progress Note Service Page: [phone]  Subjective:  Feeling "tired" this am.  Denies neck pain, headache. Patient concerned that she has had this 3 times now with no etiology found.  Patient also reports that she would like to establish care.  Objective: Temp:  [97.6 F (36.4 C)-98.4 F (36.9 C)] 98 F (36.7 C) (05/09 0551) Pulse Rate:  [55-66] 58 (05/09 0659) Resp:  [16-18] 16 (05/09 0551) BP: (86-111)/(44-66) 90/62 mmHg (05/09 0659) SpO2:  [97 %-100 %] 97 % (05/09 0551)  Exam: General: NAD, resting comfortably Cardiovascular: RRR. No murmurs, rubs, or gallops. Respiratory: CTAB, no wheezes or crackles Extremities: No LE edema. Neuro: No focal deficits.   CBC BMET   Recent Labs Lab 11/23/12 0041 11/23/12 1000 11/24/12 0540  WBC 9.2 8.0 9.3  HGB 13.2 12.7 11.5*  HCT 38.2 36.0 33.0*  PLT 229 230 222    Recent Labs Lab 11/23/12 0041 11/23/12 1000 11/24/12 0540  NA 137  --  140  K 3.6  --  3.0*  CL 103  --  110  CO2 24  --  21  BUN 14  --  12  CREATININE 0.68 0.64 0.58  GLUCOSE 105*  --  107*  CALCIUM 9.2  --  8.1*     Results for orders placed during the hospital encounter of 11/23/12 (from the past 24 hour(s))  IGG, IGA, IGM     Status: None   Collection Time    11/24/12 10:36 AM      Result Value Range   IgG (Immunoglobin G), Serum 963  690 - 1700 mg/dL   IgA 85  69 - 454 mg/dL   IgM, Serum 72  52 - 098 mg/dL  IGE     Status: None   Collection Time    11/24/12 10:36 AM      Result Value Range   IgE (Immunoglobulin E), Serum 13.8  0.0 - 180.0 IU/mL  C3 COMPLEMENT     Status: None   Collection Time    11/24/12 10:36 AM      Result Value Range   C3 Complement 113  90 - 180 mg/dL  C4 COMPLEMENT     Status: None   Collection Time    11/24/12 10:36 AM      Result Value Range   Complement C4, Body Fluid 27  10 - 40 mg/dL   RMSF - IgG and IgM: 1.19 and 0.32 (normal) HIV - non reactive RPR - non reactive B.  Burgdorferi IgG and IgM - 0.25 (normal) CSF - Glucose 50; Protein 110; WBC 78 (94% Lymphs); Culture NTD Blood culture - NTD.  Assessment/Plan: 37 yo [name] with history of aseptic meningitis presents with headache and neck stiffness x3 days.   1. Headache and neck stiffness: likely aseptic or viral meningitis (based on CSF results), ddx includes bacterial vs fungal vs Lyme disease vs RMSF vs Syphilis vs drug-induced vs Brittney [name]. At this time appears to be related to viral or inflammatory process with normal glucose and elevated protein, no organisms on CSF gram stain -S/p Vanc, CTX, acyclovir and dexamethasone in ED  -Blood and CSF cultures NTD.  Will D/C CTX today.  - HIV, RPR, B. Burgdorferi IgG and IgM, C3 & C4, IgG, IgA, IgM all normal.  -Awaiting ehrlichia, quantiferon gold, CD4,and ANA  -Will treat pain with PRN tylenol, and Toradol -ID c/s appreciate Dr. Moshe Cipro help in managing this patient.    FEN/GI: NS @ [facility]  Ppx: Lovenox SQ  Dispo: Anticipate discharge home today pending ID recommendations.   Brittney Other, DO 11/25/2012, 7:27 AM

## 2012-11-25 NOTE — Discharge Summary (Signed)
Family Medicine Teaching [facility] Discharge Summary  Patient name: Brittney [name] Medical record number: [id number] Date of birth: [birth year] Age: 37 y.o. Gender: female Date of Admission: 11/23/2012  Date of Discharge: 11/25/12 Admitting Physician: Brittney Pagan, MD  Primary Care Provider: No primary provider on file. - Patient will establish care with [place] at [facility]  Indication for Hospitalization: Headache, Neck pain  Discharge Diagnoses:  Principal Problem:   Aseptic meningitis  Brief Hospital Course:  37 yo [name] with history of aseptic meningitis presents with headache and neck stiffness x3 days  1) Aseptic meningitis - Given presentation and PMH, there was concern for meningitis. - Lumbar culture was obtained and revealed glucose 50, protein 110, and WBC 78 (94% Lymphs). - Patient was started on empiric Vancomycin, CTX, acyclovir and Dexamethasone in the ED.  Patient was also started on Doxycycline on admission. - ID was consulted for further recommendations and to aid in workup/evaluation. - Extensive laboratory work up was obtained - HIV, RPR, Lyme titers, C3, C4, IgG, IgA, IgM, RMSF titers, Cryptococcal ag (CSF), CD4, and ANA.  All of the studies were unremarkable (see below) - Vancomycin and Doxycycline were discontinued after 1 dose (per ID recommendations). Patient was treated with IV CTX and Acyclovir for 2 days.  CSF cultures remained negative during admission and CTX was discontinued on 5/9. - CSF HSV PCR was supposed to be obtained during admission but was not due to lack of remaining CSF.  Arbovirus IgG was also ordered but was not obtained as well.    - Patient was well appearing at discharge and was sent home on 14 day course of Valtrex per ID recommendations.   Significant Labs and Imaging:   CBC BMET   Recent Labs Lab 11/23/12 1000 11/24/12 0540 11/25/12 0900  WBC 8.0 9.3 5.1  HGB 12.7 11.5* 11.6*  HCT 36.0 33.0* 34.8*  PLT 230  222 216    Recent Labs Lab 11/23/12 0041 11/23/12 1000 11/24/12 0540 11/25/12 0900  NA 137  --  140 143  K 3.6  --  3.0* 3.6  CL 103  --  110 110  CO2 24  --  21 27  BUN 14  --  12 9  CREATININE 0.68 0.64 0.58 0.63  GLUCOSE 105*  --  107* 89  CALCIUM 9.2  --  8.1* 8.6     IgG - 963; IgA - 85; IgM - 72. ANA - Negative RMSF - IgG and IgM: 0.12 and 0.32 (normal)  Cryptococcal ag - Negative CD4 T Cell - 1120; 40% Ehrlichiosis ab - Negative Quatiferon Gold - Negative HIV - non reactive  RPR - non reactive  B. Burgdorferi IgG and IgM - 0.25 (normal) CSF - Glucose 50; Protein 110; WBC 78 (94% Lymphs); Culture negative x 2 days Blood culture - Negative to date  Procedures: Lumbar puncture  Consultations: ID, Dr. Ninetta Lights  Discharge Medications:    Medication List    TAKE these medications       ibuprofen 200 MG tablet  Commonly known as:  ADVIL,MOTRIN  Take 200 mg by mouth every 6 (six) hours as needed for pain. For pain     valACYclovir 1000 MG tablet  Commonly known as:  VALTREX  Take 1 tablet (1,000 mg total) by mouth 3 (three) times daily. For 14 days.       Issues for Follow Up:  1) Please follow up studies below 2) Completion of Valtrex course  Outstanding Results:  HIV RNA Quant; Final Blood and CSF culture results  Discharge Instructions:  Patient was counseled important signs and symptoms that should prompt return to medical care, changes in medications, dietary instructions, activity restrictions, and follow up appointments.   Follow-up Information   Follow up with Everlene Other, DO On 11/30/2012. (2:30 pm)    Contact information:   [address] [place] [address] [phone]       Follow up with Lillia Abed, MD On 12/07/2012. (1045 am)    Contact information:   [address] [place] [address] [phone]       Discharge Condition: Stable - discharged home.  Everlene Other, DO 11/25/2012, 6:54 PM

## 2012-11-26 LAB — CSF CULTURE W GRAM STAIN

## 2012-11-26 NOTE — Discharge Summary (Signed)
[facility] Attending Admission Note: Arin Vanosdol,MD I  have seen and examined this patient, reviewed their chart. I have discussed this patient with the resident. I agree with the resident's findings, assessment and care plan.  

## 2012-11-28 ENCOUNTER — Emergency Department (HOSPITAL_COMMUNITY): Payer: No Typology Code available for payment source

## 2012-11-28 ENCOUNTER — Emergency Department (HOSPITAL_COMMUNITY)
Admission: EM | Admit: 2012-11-28 | Discharge: 2012-11-28 | Disposition: A | Discharge status: Home / Self Care | Payer: No Typology Code available for payment source | Attending: Emergency Medicine | Admitting: Emergency Medicine

## 2012-11-28 ENCOUNTER — Encounter (HOSPITAL_COMMUNITY): Payer: Self-pay | Admitting: Emergency Medicine

## 2012-11-28 DIAGNOSIS — D259 Leiomyoma of uterus, unspecified: Secondary | ICD-10-CM

## 2012-11-28 DIAGNOSIS — R319 Hematuria, unspecified: Secondary | ICD-10-CM

## 2012-11-28 DIAGNOSIS — N390 Urinary tract infection, site not specified: Secondary | ICD-10-CM

## 2012-11-28 DIAGNOSIS — R51 Headache: Secondary | ICD-10-CM | POA: Insufficient documentation

## 2012-11-28 DIAGNOSIS — D539 Nutritional anemia, unspecified: Secondary | ICD-10-CM | POA: Insufficient documentation

## 2012-11-28 DIAGNOSIS — R197 Diarrhea, unspecified: Secondary | ICD-10-CM | POA: Insufficient documentation

## 2012-11-28 DIAGNOSIS — R1013 Epigastric pain: Secondary | ICD-10-CM | POA: Insufficient documentation

## 2012-11-28 DIAGNOSIS — Z3202 Encounter for pregnancy test, result negative: Secondary | ICD-10-CM | POA: Insufficient documentation

## 2012-11-28 DIAGNOSIS — Z8661 Personal history of infections of the central nervous system: Secondary | ICD-10-CM

## 2012-11-28 LAB — URINE MICROSCOPIC-ADD ON

## 2012-11-28 LAB — URINALYSIS, ROUTINE W REFLEX MICROSCOPIC
Bilirubin Urine: NEGATIVE
Protein, ur: NEGATIVE mg/dL
Specific Gravity, Urine: 1.031 — ABNORMAL HIGH (ref 1.005–1.030)
Urobilinogen, UA: 0.2 mg/dL (ref 0.0–1.0)

## 2012-11-28 LAB — CBC WITH DIFFERENTIAL/PLATELET
Basophils Absolute: 0 10*3/uL (ref 0.0–0.1)
HCT: 40.1 % (ref 36.0–46.0)
Lymphocytes Relative: 31 % (ref 12–46)
Neutro Abs: 3.4 10*3/uL (ref 1.7–7.7)
Platelets: 264 10*3/uL (ref 150–400)
RDW: 12.6 % (ref 11.5–15.5)
WBC: 5.7 10*3/uL (ref 4.0–10.5)

## 2012-11-28 LAB — COMPREHENSIVE METABOLIC PANEL
ALT: 38 U/L — ABNORMAL HIGH (ref 0–35)
AST: 22 U/L (ref 0–37)
Alkaline Phosphatase: 49 U/L (ref 39–117)
CO2: 25 mEq/L (ref 19–32)
Chloride: 107 mEq/L (ref 96–112)
GFR calc non Af Amer: >90 mL/min (ref >90)
Sodium: 141 mEq/L (ref 135–145)
Total Bilirubin: 0.1 mg/dL — ABNORMAL LOW (ref 0.3–1.2)

## 2012-11-28 LAB — HIV-1 RNA QUANT-NO REFLEX-BLD: HIV-1 RNA Quant, Log: <1.3 {Log} (ref <1.30)

## 2012-11-28 LAB — GLUCOSE, CAPILLARY: Glucose-Capillary: 80 mg/dL (ref 70–99)

## 2012-11-28 MED ORDER — BUTALBITAL-APAP-CAFFEINE 50-325-40 MG PO TABS
2.0000 | ORAL_TABLET | Freq: Once | ORAL | Status: AC
  Administered 2012-11-28: 2 via ORAL
  Filled 2012-11-28: qty 2

## 2012-11-28 MED ORDER — SULFAMETHOXAZOLE-TRIMETHOPRIM 800-160 MG PO TABS: 1.0000 | ORAL_TABLET | Freq: Two times a day (BID) | ORAL | Status: DC

## 2012-11-28 NOTE — ED Notes (Signed)
Pt returned from CT °

## 2012-11-28 NOTE — ED Notes (Signed)
Pt's CBG was 80 when I checked it.2:22 pm JG.

## 2012-11-28 NOTE — Discharge Instructions (Signed)
Please call and follow-up with [facility] regarding visit to the ED. Please call and set-up an appointment with Dr. Myer Haff, urology, regarding Urinary tract infection and for re-check. Please call and set-up an appointment with [facility] regarding uterine fibroid found on CT scan. 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 Please keep appointments and see Dr. Adriana Simas and Dr. Oley Balm as scheduled.  Please take Bactrim as prescribed - take one tablet by mouth two times a day for 7 days. Please continue to stay hydrated and rest Please monitor symptoms and if symptoms are to worsen or change (fever, chills, neck stiffness, sweating, neck pain, changes to vision) please report back to the ED.  Below is a list of physicians, please pick one, call, and set-up an appointment to follow-up - it is important to have a primary care doctor to see.    Hematuria, en el adulto (Hematuria, Adult) Usted presenta hematuria (sangre en la orina). La causa puede ser una infeccin en la vejiga (cistitis), una infeccin renal (pielonefritis) o una infeccin en la prstata (prostatitis) o clculos renales. Las infecciones responden a antibiticos (medicamentos que destruyen grmenes), y si se trata de clculos renales, generalmente pueden eliminarse con la orina sin ms tratamiento. Si le han indicado antibiticos, tome toda la cantidad prescripta hasta que se terminen. Podr sentirse bien dentro de [address], pero debetomarlosmedicamentoshastaterminareltratamiento de lo contrario la infeccin puede no solucionarse y luego ser ms difcil de Warehouse manager. Si no le han prescripto antibiticos, la causa de la hematuria no es una infeccin. Ser necesario investigar en ms profundidad para conocer la causa. INSTRUCCIONES PARA EL CUIDADO DOMICILIARIO  Beba lquidos en abundancia, 3 a 4 litros por da. Si le han diagnosticado una infeccin, se recomienda especialmente el jugo de  arndanos rojos, [address] de grandes cantidades de [place].  Evite la cafena, el t y las bebidas carbonatadas, debido a que tienden a Surveyor, minerals.  Evite el alcohol ya que puede Teacher, adult education.  Utilice los medicamentos de venta libre o de prescripcin para Chief Technology Officer, Environmental health practitioner o la [place], segn se lo indique el profesional que lo asiste.  Si le han diagnosticado clculos renales, siga las intrucciones del profesional que lo asiste con respecto a Ecologist la orina para TEFL teacher clculo. PARA PREVENIR FUTURAS INFECCIONES:  Vace la vejiga con frecuencia. Evite retener la orina durante largos perodos.  Despus de mover el intestino, las mujeres deben higienizarse la regin perineal desde adelante hacia atrs. Use slo un papel tissue por vez.  Si es [employer], vace la vejiga antes y despus de Management consultant.  Si presenta dolor de espalda, fiebre, nuseas (ganas de vomitar), vmitos o sus sntomas (problemas) no mejoran en 3 das, concurra nuevamente a una consulta con el profesional que lo asiste. Vistelo antes si empeora. Si le han indicado que vuelva para realizar estudios ms profundos, asegrese de [employer]. Si la causa de la hematuria no es una infeccin, necesitar que le tomen radiografas. El profesional lo comentar con usted. SOLICITE ATENCIN MDICA DE INMEDIATO SI:  Presenta una temperatura persistente superior a 102 F (38.9 C).  Comienza a vomitar de Building services engineer o no puede [employer].  Siente un dolor intenso en la espalda o el abdomen an [employer] Computer Services.  Elimina cogulos grandes o sangre con la [place].  Sufre una convulsin, se marea o pierde el conocimiento. EST SEGURO QUE:   Comprende las instrucciones para el alta  mdica.  Controlar su enfermedad.  Solicitar atencin mdica de inmediato segn las indicaciones. Document Released: 07/06/2005 Document Revised: 09/28/2011 [facility] Patient  Information 2013 [place], [place].  Fibromas (Fibroids) Los fibromas son bultos (tumores) que pueden Conservation officer, nature del cuerpo de Nurse, mental health. Estos tumores no son cancerosos. Pueden variar en tamao, peso y lugar en el que crecen. CUIDADOS EN EL HOGAR  No tome aspirina.  Anote el nmero de apsitos o tampones que [place] durante el perodo. Infrmelo a su mdico. Esto puede ayudar a determinar el mejor tratamiento para usted. SOLICITE AYUDA DE INMEDIATO SI:  Siente dolor en la zona inferior del vientre (abdomen) y no se alivia con analgsicos.  Tiene clicos que no se calman con medicamentos  Aumenta el sangrado entre perodos o durante el mismo.  Sufre mareos o se desvanece (se desmaya).  El dolor en el vientre [place]. ASEGRESE DE QUE:  Comprende estas instrucciones.  Controlar su enfermedad.  Solicitar ayuda de inmediato si no mejora o empeora. Document Released: 10/21/2010 Document Revised: 09/28/2011 [facility] Patient Information 2013 [place], [place]. Cefalea raqudea, tratamiento conservador (Spinal Headache, Conservative Treatment) Luego de una puncin lumbar o de una anestesia epidural, muchas veces se produce una filtracin de LCR (lquido cefalorraqudeo) en la duramadre. La duramadre es una de las membranas protectoras que cubren el cerebro y la mdula espinal. Esta filtracin hace que disminuya la presin del LCR. Se piensa que, la traccin de estructuras sensibles al dolor en el cerebro y Facilities manager (dilatacin) de los vasos sanguneos de la cabeza cuando el paciente est en posicin erguida, producen dolor de cabeza. Generalmente la cefalea dura hasta que el orificio se cierra y la presin del LCR se reestablece. Puede durar Time Warner y rara vez se extiende ms de una semana. ALGUNAS ALTERNATIVAS TERAPUTICAS AL PARCHE DE SANGRE EPIDURAL  Reposo en cama: Los sntomas de la CPPD disminuyen al Assurant. Hacer que el paciente se recueste  sobre la espalda durante cierto tiempo (por ejemplo 24 horas) despus de una puncin lumbar, no tiene Education officer, environmental. Solo retrasa el comienzo de la CPPD.  Hidratacin: Deber mantenerse la hidratacin (ingesta de lquidos) normal del paciente. La hidratacin extra no Oceanographer, pero la deshidratacin puede hacer que los sntomas empeoren.  Analgsicos: Los Biochemist, clinical y, en algunos casos los agentes antiinflamatorios no esteroides generalmente se administran para el tratamiento de la cefalea.  Cafena: El consumo de cafea es un tipo de tratamiento que ayuda a Primary school teacher los vasos sanguneos cerebrales. Los pacientes deberan consumir cafena en las primeras horas del da, de modo de poder dormir por la noche. Podrn administrarse por va en la vena (intravenosa) 500 mg de benzoato sdico de cafena . Si la primera dosis no surte el efecto deseado, podr administrarse otra dosis dos horas ms tarde. Las bebidas con cafena (colas, t, caf) pueden ser de [employer].  Inyeccin salina epidural: Inyecciones de gran volumen (bolos) o infusin de solucin salina normal por va epidural podrn ayudar rpidamente y en forma temporal a aumentar la presin epidural. Las infusiones disminuyen la velocidad con la que el LCR se filtra a travs del orificio dural. Este procedimiento acelera el proceso natural de curacin. Aunque la solucin salina por va epidural puede ser Neomia Dear tcnica de [place], generalmente los parches de sangre epidural tienen un porcentaje mayor de [place]. SOLICITE ATENCIN MDICA DE INMEDIATO SI:   No obtiene alivio con los [employer] han prescripto o tiene un dolor  que aumenta.  La temperatura oral se eleva sin motivo por encima de 102 F (38.9 C) o segn le indique el profesional que lo asiste.  Presenta rigidez en el cuello.  Prdida del control del intestino o de la vejiga.  Desarrolla sntomas graves diferentes de los  primeros.  Presenta dificultad para caminar. Document Released: 07/06/2005 Document Revised: 09/28/2011 [facility] Patient Information 2013 [place], [place]. Infeccin urinaria  (Urinary Tract Infection)  La infeccin urinaria puede ocurrir en Corporate treasurer del tracto urinario. El tracto urinario es un sistema de drenaje del cuerpo por el que se eliminan los desechos y el exceso de [place]. El tracto urinario [place] riones, dos urteres, la vejiga y Engineer, mining. Los riones son rganos que tienen forma de frijol. Cada rin tiene aproximadamente el tamao del puo. Estn situados debajo de las [place], uno a cada lado de la columna vertebral CAUSAS  La causa de la infeccin son los microbios, que son organismos microscpicos, que incluyen hongos, virus, y bacterias. Estos organismos son tan pequeos que slo pueden verse a travs del microscopio. Las bacterias son los microorganismos que ms comnmente causan infecciones urinarias.  SNTOMAS  Los sntomas pueden variar segn la edad y el sexo del paciente y por la ubicacin de la infeccin. Los sntomas en las mujeres jvenes incluyen la necesidad frecuente e intensa de orinar y una sensacin dolorosa de ardor en la vejiga o en la uretra durante la miccin. Las mujeres y los hombres mayores podrn sentir cansancio, temblores y debilidad y Futures trader musculares y Engineer, mining abdominal. Si tiene [place], puede significar que la infeccin est en los riones. Otros sntomas son dolor en la espalda o en los lados debajo de las [place], nuseas y vmitos.  DIAGNSTICO  Para diagnosticar una infeccin urinaria, el mdico le preguntar acerca de sus sntomas. [employer] una [place] de [place]. La muestra de orina se analiza para Engineer, manufacturing bacterias y glbulos blancos de Risk manager. Los glbulos blancos se forman en el organismo para ayudar a Artist las infecciones.  TRATAMIENTO  Por lo general, las infecciones urinarias pueden tratarse con  medicamentos. Debido a que la Harley-Davidson de las infecciones son causadas por bacterias, por lo general pueden tratarse con antibiticos. La eleccin del antibitico y la duracin del tratamiento depender de sus sntomas y el tipo de bacteria causante de la infeccin.  INSTRUCCIONES PARA EL CUIDADO EN EL HOGAR   Si le recetaron antibiticos, tmelos exactamente como su mdico le indique. Termine el medicamento aunque se sienta mejor despus de haber tomado slo algunos.  Beba gran cantidad de lquido para mantener la orina de tono claro o color amarillo plido.  Evite la cafena, el t y las [address] Estas sustancias irritan la vejiga.  Vaciar la vejiga con frecuencia. Evite retener la orina durante largos perodos.  Vace la vejiga antes y despus de Management consultant.  Despus de mover el intestino, las mujeres deben higienizarse la regin perineal desde adelante hacia atrs. Use slo un papel tissue por vez. SOLICITE ATENCIN MDICA SI:   Siente dolor en la espalda.  Le sube la fiebre.  Los sntomas no mejoran luego de [address]. SOLICITE ATENCIN MDICA DE INMEDIATO SI:   Siente dolor intenso en la espalda o en la zona inferior del abdomen.  Comienza a sentir escalofros.  Tiene nuseas o vmitos.  Tiene una sensacin continua de quemazn o molestias al [employer]. ASEGRESE DE QUE:   Comprende estas instrucciones.  Controlar su enfermedad.  Solicitar [employer]  de inmediato si no mejora o si empeora. Document Released: 04/15/2005 Document Revised: 01/05/2012 [facility] Patient Information 2013 [place], [place].   RESOURCE GUIDE  Chronic Pain Problems: Contact [facility]  [phone] Patients need to be referred by their primary care doctor.  Insufficient Money for Medicine: Contact United Way:  call "211."   No Primary Care Doctor: - Call Health Connect  [phone] - can help you locate a primary care doctor that  accepts your insurance,  provides certain services, etc. - Physician Referral Service- [phone]  Agencies that provide inexpensive medical care: - [facility]  [phone] - [facility]  [phone] - [facility]  [phone] - Women's Clinic  [phone] - Planned Parenthood  [phone] [facility]  [phone]  Medicaid-accepting [facility] Providers: - [facility]- [address]  [phone], Mon-Fri 9am-7pm, Sat 9am-1pm - [facility]- [address] [place], Suite [place]  [phone] - [facility]- [address] [place]  [phone] [facility]- [address]  [phone] - Renaye Rakers- [address] [place], [address], [phone]  Only accepts [place] Access [place] patients after they have their name  applied to their card  Self Pay (no insurance) in [place]: - Sickle Cell Patients: Dr Willey Blade, [facility]  [address] [place], [phone] - [facility]- [address] [place]  [phone]       [facility] [place]- [address]       -     [facility]- see information above (Speak to Citigroup if you do not have insurance)       -  [facility]- [address],  [phone]       -  [facility]- [address] [phone]       -  Dr Julio Sicks-  [address], [place], [phone]       -  Urgent Medical and [facility] [place] - [address] [phone]       -  [facility]- [address], [phone], also [address], [phone]       -    [facility]- [address] [place], [phone], 1st & 3rd Saturday        every month, 10am-1pm  [facility] [address] [place], [place] [address] [phone]  [facility] [address] [place], [place] [address] [phone]  1) Find a  Doctor and Pay Out of Pocket Although you won't have to find out who is covered by your insurance plan, it is a good idea to ask around and get recommendations. You will then need to call the office and see if the doctor you have chosen will accept you as a new patient and what types of options they offer for patients who are self-pay. Some doctors offer discounts or will set up payment plans for their patients who do not have insurance, but you will need to ask so you aren't surprised when you get to your appointment.  2) Contact Your Local Health Department Not all health departments have doctors that can see patients for sick visits, but many do, so it is worth a call to see if yours does.  If you don't know where your local health department is, you can check in your phone book. The CDC also has a tool to help you locate your state's health department, and many state websites also have listings of all of their local health departments.  3) Find a Walk-in Clinic If your illness is not likely to be very severe or complicated, you may want to try a walk in clinic. These are popping up all over the country in pharmacies, drugstores, and shopping centers. They're usually staffed by nurse practitioners or physician assistants that have been trained to treat common illnesses and complaints. They're usually fairly quick and inexpensive. However, if you have serious medical issues or chronic medical problems, these are probably not your best option  STD Testing - [facility] Department of [facility] [place], STD Clinic, [address] [employer], phone [phone] or [phone].  Monday - Friday, call for an appointment. [facility] Department of [employer], STD Clinic, [place] [address], [place], phone [phone] or [phone].  Monday - Friday, call for an appointment.  Abuse/Neglect: [facility] Child Abuse Hotline (209)353-5392 [facility] Child Abuse  Hotline 206-670-9522 (After Hours)  Emergency Shelter:  [facility] 8604514728  Maternity Homes: - Room at the [place] [facility] 8187549127 - Rebeca Alert Services ([phone]  MRSA Hotline #:   (605)058-9421  Dental Assistance If unable to pay or uninsured, contact:  [facility]. to become qualified for the adult dental clinic.  Patients with Medicaid: [facility] [phone] [address], [phone] [address] [phone]  If unable to pay, or uninsured, contact [facility] [phone] in [place], [phone] in [place] - [facility]) to become qualified for the adult dental clinic  [facility] [address] [place], [place] [address] [phone] [email or web address]  Other Proofreader Services: - [facility]- [address] [place], [place], [place], [address], [phone], Ext. [phone], 2nd and 4th Thursday of the month at 6:30am.  10 clients each day by appointment, can sometimes see walk-in patients if someone does not show for an appointment. [facility]- [address] [place], [place], [address], [phone] - [facility] [address], [place], [place], [address], [phone] - [facility]- [phone] [facility] Health Department- [phone] [facility] Health [phone]       Behavioral Health Resources in the [facility]  Intensive Outpatient Programs: [facility]      [address] [place], [place] [phone] Both a day and evening program       [facility] Outpatient     [address]        [place], [place] [address] [phone]         ADS: [facility] [address] [place] [phone]  [facility] Mental Health ACCESS LINE: [phone] or [phone] [address] [place], [place]  [address] [email or web address]   Substance Abuse Resources: - Alcohol and Drug Services  [phone] - [facility] [phone] - The N[facility]ar [phone] Floydene Flock [phone] - Residential & Outpatient Substance Abuse Program  [phone]  Psychological Services: [facility]  [phone] [facility] Services  [phone] - [facility], [phone] [place]. [address] [place], ACCESS LINE: 984-304-2726 or ([phone], [email or web address]  Mobile Crisis Teams:  [facility]rapeutic Alternatives         Mobile Crisis Care Unit [phone]             [facility] [address] Ginette Otto [phone]                                         Interventionist [address] [place] [phone]  Self-Help/Support Groups: Mental Health Assoc. of The [employer] of support groups 443-178-5924 (call for more info)   Narcotics Anonymous (NA) Caring Services [address] [place] - 2 meetings at this location  Residential Treatment Programs:  [facility]      [address]        [place]       [phone]         [facility] [address] [place] [address]33 [place], [place]  [address] [phone]  [facility]  [address] [place], [place] [address] [phone] Admissions: 8am-3pm M-F  [facility]     [address]        [place], [place] [address]       [phone]         [facility] [address] [place], [place] [phone]  [facility] [address] [place], [place] [phone]  [facility] - Intensive Outpatient      [address]     [place], [place]       [phone]         [facility] (Addiction Recovery Care Assoc.)     [address] [place], [place] [phone] or [phone]  Residential Treatment Services (RTS), Medicaid [address] [place], [place] [phone]  Fellowship [address]                                               [address] [place] [phone]  [facility] Resources: [phone]               General Therapy                                                Angie Fava, PhD        [address] [place], [place] [address]         [phone]   Insurance  [facility]   [address] [place], [place] [address] [phone]  [facility] [address] [place], [place] [address] [phone] Insurance/Medicaid/sponsorship through [facility]  Faith and Families                                              [phone]  [place], [place] [address]    Therapy/tele-psych/case         [phone]          [facility] [address], [place]  [address]  Adolescent/group home/case management [phone]                                           Creola Corn PhD       General therapy       Insurance   [phone]         Dr. Lolly Mustache, [place], M-F [phone]  [facility] [place]  United Way [facility] Dept. [address]                 [address]         [address] [place] [address]  Blondell Reveal Phone:  [phone]                                  Phone:  [phone]                   Phone:  [phone]  [facility] Mental Health, [phone] - [facility] - [facility]- [phone]       -     [facility] in [place], [address],             [phone], Insurance  [place] Child Abuse Hotline (740)139-2166 or [phone] (After Hours)

## 2012-11-28 NOTE — ED Notes (Signed)
Discharge instructions reviewed. Pt verbalized understanding.  

## 2012-11-28 NOTE — ED Provider Notes (Signed)
History     CSN: [id number]  Arrival date & time 11/28/12  8295   First MD Initiated Contact with Patient 11/28/12 [phone]      Chief Complaint  Patient presents with  . Nausea  . Back Pain  . Hematuria    (Consider location/radiation/quality/duration/timing/severity/associated sxs/prior treatment) HPI Comments: Brittney [name] is a 37 y/o with PMHx of meningitis x 3 times - most recent diagnosis was 11/23/2012, presenting to the ED with nausea, headache, back pain and hematuria. Patient reported that nausea has been ongoing for the past 3 days, but has gotten progressively worse starting this morning. Stated that she has not vomited. Patient reported that she mild epigastric discomfort that started the other day, reported that it is an aching sensation that has gotten worse. Stated that she has been eating without any problems, but has noticed decrease in appetite. Patient reported headache that has been ongoing since yesterday, described as a constant throbbing sensation bilaterally, without radiation. Patient reported lower back pain that started yesterday as well, mainly localized to the mid-spinal region that hurts with motion and stated that she not been able to sleep due to discomfort. Patient reported that since Saturday she has been having blood in her urine - has occurred every single time she urinates. Stated that she had chills last night and 2 episodes of diarrhea yesterday - denied blood and mucus presence. Stated that she has been mildly depressed lately and has been crying for no reason. Denied blurred vision, visual distortions, fever, leg pain, numbness, tingling, chest pain, shortness of breathe, difficulty breathing, dysuria, pus in urine, abdominal fullness, urgency, frequency, LOC, vaginal pain, vaginal discharge, vaginal bleeding, pelvic pain.  Patient has an appointment with Dr. Adriana Simas 11/30/2012 and Dr. Oley Balm 12/07/2012  LMP last week.   Patient is a 37 y.o. female presenting with  back pain and hematuria. The history is provided by the patient. No language interpreter was used.  Back Pain Associated symptoms: abdominal pain and headaches   Associated symptoms: no chest pain, no dysuria, no fever, no numbness and no weakness   Hematuria Associated symptoms include abdominal pain, headaches and nausea. Pertinent negatives include no chest pain, chills, congestion, coughing, fatigue, fever, joint swelling, neck pain, numbness, rash, sore throat, vomiting or weakness.    Past Medical History  Diagnosis Date  . Meningitis 2010, 2007    Past Surgical History  Procedure Laterality Date  . Cesarean section      History reviewed. No pertinent family history.  History  Substance Use Topics  . Smoking status: Never Smoker   . Smokeless tobacco: Never Used  . Alcohol Use: No    OB History   Grav Para Term Preterm Abortions TAB SAB Ect Mult Living                  Review of Systems  Constitutional: Negative for fever, chills and fatigue.  HENT: Negative for ear pain, congestion, sore throat, rhinorrhea, trouble swallowing, neck pain, neck stiffness and tinnitus.   Eyes: Negative for photophobia, pain and visual disturbance.  Respiratory: Negative for cough, chest tightness and shortness of breath.   Cardiovascular: Negative for chest pain.  Gastrointestinal: Positive for nausea, abdominal pain and diarrhea. Negative for vomiting, constipation and blood in stool.  Genitourinary: Positive for hematuria. Negative for dysuria, flank pain, decreased urine volume, vaginal bleeding, vaginal discharge, difficulty urinating and vaginal pain.  Musculoskeletal: Positive for back pain. Negative for joint swelling and gait problem.  Skin: Negative for  rash and wound.  Neurological: Positive for headaches. Negative for dizziness, speech difficulty, weakness, light-headedness and numbness.  All other systems reviewed and are negative.    Allergies  Review of patient's  allergies indicates no known allergies.  Home Medications   Current Outpatient Rx  Name  Route  Sig  Dispense  Refill  . ibuprofen (ADVIL,MOTRIN) 200 MG tablet   Oral   Take 600 mg by mouth every 6 (six) hours as needed for pain. For pain         . valACYclovir (VALTREX) 1000 MG tablet   Oral   Take 1 tablet (1,000 mg total) by mouth 3 (three) times daily. For 14 days.   42 tablet   0   . sulfamethoxazole-trimethoprim (BACTRIM DS,SEPTRA DS) 800-160 MG per tablet   Oral   Take 1 tablet by mouth 2 (two) times daily. One po bid x 7 days   14 tablet   0     BP 118/80  Pulse 72  Temp(Src) 98.1 F (36.7 C) (Oral)  Resp 18  Ht 5\' 6"  (1.676 m)  Wt 140 lb (63.504 kg)  BMI 22.61 kg/m2  SpO2 99%  LMP 11/16/2012  Physical Exam  Nursing note and vitals reviewed. Constitutional: She is oriented to person, place, and time. She appears well-developed and well-nourished. No distress.  HENT:  Head: Normocephalic and atraumatic.  Mouth/Throat: Oropharynx is clear and moist. No oropharyngeal exudate.  Uvula midline, symmetrical elevation  Eyes: Conjunctivae and EOM are normal. Pupils are equal, round, and reactive to light. Right eye exhibits no discharge. Left eye exhibits no discharge.  Neck: Normal range of motion. Neck supple. No tracheal deviation present.  Negative nuchal rigidity Negative neck stiffness Negative lymphadenopathy  Cardiovascular: Normal rate, regular rhythm and normal heart sounds.  Exam reveals no friction rub.   No murmur heard. Pulmonary/Chest: Effort normal and breath sounds normal. No respiratory distress. She has no wheezes. She has no rales.  Abdominal: Soft. Bowel sounds are normal. She exhibits no shifting dullness, no distension, no fluid wave, no ascites and no mass. There is no hepatosplenomegaly. There is generalized tenderness. There is no rebound, no guarding and no CVA tenderness.    Musculoskeletal: Normal range of motion. She exhibits no  edema and no tenderness.       Lumbar back: She exhibits tenderness. She exhibits normal range of motion, no bony tenderness, no swelling, no edema, no deformity and no laceration.       Back:  Lymphadenopathy:    She has no cervical adenopathy.  Neurological: She is alert and oriented to person, place, and time. No cranial nerve deficit. She exhibits normal muscle tone. Coordination normal.  Reflex Scores:      Patellar reflexes are 2+ on the right side and 2+ on the left side. Cranial nerves III-XII grossly intact Sensation to upper and lower extremities present with differentiation to sharp and dull sensation bilaterally  Skin: Skin is warm and dry. No rash noted. She is not diaphoretic. No erythema.  Psychiatric: Her behavior is normal. Thought content normal.  Patient started crying during interview - stated that she does not know why she cries for no reason.    ED Course  Procedures (including critical care time)  Fioricet given for headache and nausea - pain controlled.  Labs Reviewed  COMPREHENSIVE METABOLIC PANEL - Abnormal; Notable for the following:    Glucose, Bld 61 (*)    ALT 38 (*)    Total Bilirubin 0.1 (*)  All other components within normal limits  URINALYSIS, ROUTINE W REFLEX MICROSCOPIC - Abnormal; Notable for the following:    APPearance HAZY (*)    Specific Gravity, Urine 1.031 (*)    Hgb urine dipstick MODERATE (*)    Leukocytes, UA SMALL (*)    All other components within normal limits  URINE MICROSCOPIC-ADD ON - Abnormal; Notable for the following:    Squamous Epithelial / LPF MANY (*)    Bacteria, UA FEW (*)    Crystals CA OXALATE CRYSTALS (*)    All other components within normal limits  URINE CULTURE  CBC WITH DIFFERENTIAL  LIPASE, BLOOD  GLUCOSE, CAPILLARY  POCT PREGNANCY, URINE   Ct Abdomen Pelvis Wo Contrast  11/28/2012  *RADIOLOGY REPORT*  Clinical Data: Nausea, back pain, hematuria  CT ABDOMEN AND PELVIS WITHOUT CONTRAST  Technique:   Multidetector CT imaging of the abdomen and pelvis was performed following the standard protocol without intravenous contrast.  Comparison: None.  Findings: Lung bases are clear.  No pericardial fluid.  No focal hepatic lesion on this noncontrast exam.  The gallbladder, pancreas, spleen, adrenal glands, and kidneys are normal.  No nephrolithiasis or ureterolithiasis.  No obstructive uropathy.  The stomach, small bowel, and cecum are normal.  Appendix is not identified but there are no secondary signs of acute appendicitis. The colon and rectosigmoid colon are normal.  Abdominal aorta is normal caliber.  No retroperitoneal periportal lymphadenopathy.  No free fluid the pelvis.  There is a linear calcific density within the myometrium of the uterus which appears dystrophic. Bladder is normal.  No pelvic lymphadenopathy.  Review of  bone windows demonstrates no aggressive osseous lesions.  IMPRESSION:  1.  No nephrolithiasis or ureterolithiasis.  2.  No acute abdominal or pelvic findings.   Original Report Authenticated By: Genevive Bi, M.D.      1. Uterine fibroid   2. UTI (urinary tract infection)   3. Hematuria   4. History of meningitis       MDM  Patient afebrile, normotensive, non-tachycardic, alert and oriented.  Discussed case with Dr. Silverio Lay - labs ordered. Lipase negative findings Urine few bacteria noted, calcium oxalate crystals noted, moderate Hgb - negative proteins, nitrites, ketons CMP negative findings Urine pregnancy negative  CBC negative findings CT abdomen and pelvic ordered to r/o stones, fibroid - uterine fibroid noted to myomterium of the uterus.   CBG - 80 (repeat) Discussed labs and imaging findings with Dr. Silverio Lay - stated that patient should be started on antibiotic therapy for UTI and to follow-up as outpatient. Patient aseptic, non-toxic appearing, in no acute distress. Urine noted bacterial infection - started patient on antibiotic therapy - can be leading to the  hematuria. Less likely to be meningitis - negative nuchal rigidity, negative WBC, negative confusion. Suspected UTI, headache possibly due to post lumbar puncture, uterine fibroid noted on the CT abdomen and pelvic. Discharged patient. Discharged patient with antibiotic therapy for UTI. Discussed with patient to continue taking antiviral medication for meningitis. Discussed with patient to keep appointments with physicians, Dr. Adriana Simas and Dr. Oley Balm that she has upcoming this month. Referred patient to Ur[facility] Center, [facility], and Urology to follow-up as outpatient. Discussed with patient to continue to stay hydrated and to rest. Discussed with patient to monitor symptoms and if symptoms are to worsen or change to report back to the ED. Patient agreed to plan of care, understood, all questions answered.        [employer],  PA-C 11/28/12 1647

## 2012-11-28 NOTE — ED Notes (Signed)
Pt here c/o nausea and back pain with hematuria; pt sts recently treated for meningitis and taking valacyclovir for that and is unsure if could be causing nausea

## 2012-11-29 LAB — CULTURE, BLOOD (ROUTINE X 2): Culture: NO GROWTH

## 2012-11-29 NOTE — ED Provider Notes (Signed)
Medical screening examination/treatment/procedure(s) were performed by non-physician practitioner and as supervising physician I was immediately available for consultation/collaboration.   [name], MD 11/29/12 [phone]

## 2012-11-30 ENCOUNTER — Encounter: Payer: Self-pay | Admitting: Family Medicine

## 2012-11-30 ENCOUNTER — Ambulatory Visit (INDEPENDENT_AMBULATORY_CARE_PROVIDER_SITE_OTHER): Payer: No Typology Code available for payment source | Admitting: Family Medicine

## 2012-11-30 VITALS — BP 112/79 | HR 83 | Temp 99.2°F | Ht 66.0 in | Wt 156.0 lb

## 2012-11-30 DIAGNOSIS — G030 Nonpyogenic meningitis: Secondary | ICD-10-CM

## 2012-11-30 DIAGNOSIS — N390 Urinary tract infection, site not specified: Secondary | ICD-10-CM | POA: Insufficient documentation

## 2012-11-30 DIAGNOSIS — A879 Viral meningitis, unspecified: Secondary | ICD-10-CM

## 2012-11-30 LAB — URINE CULTURE: Colony Count: >=100000

## 2012-11-30 MED ORDER — FLUOXETINE HCL 20 MG PO TABS: 20.0000 mg | ORAL_TABLET | Freq: Every day | ORAL | Status: DC

## 2012-11-30 NOTE — Assessment & Plan Note (Signed)
Asymptomatic at this time. Advised to finish course of Abx treatment

## 2012-11-30 NOTE — Progress Notes (Signed)
  Subjective:    Patient ID: Brittney [name], female    DOB: Apr 15, 1976, 37 y.o.   MRN: [id number]  HPI Ms. Brittney [name] presents to the clinic today for hospital followup. She was admitted on 5/7 - 5/9 with headache and neck pain concerning for aseptic meningitis.  CSF studies revealed WBC count of 78 with lymphocytic shift consistent with aseptic meningitis.  Further workup did not reveal underlying source (for full details see Discharge summary).    Translator present during history and physical examination.  1) Aseptic meningitis and Headache - Patient is currently continuing treatment with Valtrex (per ID recommendations) - She continues to endorse headache (4-5/10 in severity) with associated neck discomfort - Headache localizes to the parietal regions bilaterally.  No pulsating quality or worsening with light or sound.  Patient does report nausea but states this is constant daily.  Headache temporarily relieved by Ibuprofen. - She reports that her headache occurs more often after running and after being in the sun.  She states that the associated neck discomfort feels as if her neck is swollen. - I spoke with patient in detail with the use of interpreter about her recent admission and negative work up.  Patient very anxious and reports that "something is wrong" and she also reports that she feels as if she is going to die. She is desperately seeking an answer to her problem as she has had 3 admission for aseptic meningitis now.  She is very concerned.  2) Recent UTI - Patient seen on 5/12 in the ED and diagnosed with UTI - No dysuria or urinary urgency/frequency - No symptoms currently  Review of Systems  Constitutional: Negative for fever and chills.  HENT: Positive for neck pain.   Gastrointestinal: Positive for nausea.  Genitourinary: Negative for dysuria, urgency, flank pain and difficulty urinating.  Neurological: Positive for headaches.      Objective:   Physical Exam Filed Vitals:    11/30/12 1457  BP: 112/79  Pulse: 83  Temp: 99.2 F (37.3 C)   Exam: General: well appearing female in NAD.   Neck: No nuchal rigidity. Cardiovascular: RRR. No murmurs, rubs, or gallops. Respiratory: CTAB. No rales, rhonchi, or wheeze. Abdomen: soft, nontender, nondistended. Extremities: warm, well perfused. No LE edema. Neuro: No focal deficits. Psych: Likely anxiety component to her headache and pain as patient feels as if she is going to die.     Assessment & Plan:  See problem list

## 2012-11-30 NOTE — Assessment & Plan Note (Addendum)
Patient appears well and is compliant with antiviral.   Patient is still worried and anxious as no one has given her a reason for her multiple bouts of Aseptic meningitis.  She is still have headache and associated neck discomfort.  The etiology of this is unclear at this time.  Patient is well appearing, afebrile with normal vital signs.  Anxiety is likely contributing to headache and neck pain.  Headache does not appear like typical tension or migraine headache.  I discussed this case with Dr. Lum Babe.  I will try SSRI (Fluoxetine) as this is a second line agent in migraine and will aid anxiety component.  Patient to follow up in 2 weeks with Dr. Aviva Signs. Patient to continue Valtrex until complete.

## 2012-11-30 NOTE — Patient Instructions (Addendum)
Todo su trabajo de laboratorio en relacin con el dolor de cabeza y Engineer, mining de cuello han sido normales.   Yo creo que la ansiedad puede estar contribuyendo a sus sntomas. Entiendo que usted est preocupado, pero se sienten tranquilos de que todo es normal en esta poca.   Por favor, asegrese de seguir con el Dr. Aviva Signs en 2 semanas.

## 2012-12-03 ENCOUNTER — Telehealth (HOSPITAL_COMMUNITY): Payer: Self-pay | Admitting: Emergency Medicine

## 2012-12-03 NOTE — ED Notes (Signed)
Post ED Visit - Positive Culture Follow-up  Culture report reviewed by antimicrobial stewardship pharmacist: []  [name], Pharm.D., BCPS [x]  [name], Pharm.D., BCPS []  [name], Pharm.D., BCPS []  [name], [address].D., BCPS, AAHIVP []  [name], Pharm.D., BCPS, AAHIV  Positive urine culture No change since symptoms resolved.  Brittney [name] 12/03/2012, 8:57 AM

## 2012-12-07 ENCOUNTER — Ambulatory Visit (INDEPENDENT_AMBULATORY_CARE_PROVIDER_SITE_OTHER): Payer: No Typology Code available for payment source | Admitting: Family Medicine

## 2012-12-07 ENCOUNTER — Encounter: Payer: Self-pay | Admitting: Family Medicine

## 2012-12-07 VITALS — BP 114/74 | HR 71 | Temp 98.7°F | Ht 66.0 in | Wt 156.0 lb

## 2012-12-07 DIAGNOSIS — G030 Nonpyogenic meningitis: Secondary | ICD-10-CM

## 2012-12-07 DIAGNOSIS — A879 Viral meningitis, unspecified: Secondary | ICD-10-CM

## 2012-12-08 NOTE — Patient Instructions (Signed)
Ha sido un placer verle hoy. - Por favor tome las medicinas como se le han recetado. - Haga su proxima cita en 3- 4 mese o antes si lo necesita. Recuerde lo que conversamos acerca de los sintomas que requieren que sea [place] de Associate Professor.

## 2012-12-08 NOTE — Assessment & Plan Note (Signed)
All studies performed (HIV antibody, RPR, B burgdorferi, Rocky M, Ehrlichia, Quantiferon gold, CD4 count, IgE, IgG, IgA, IgM and ANA ) are negative. HSV and Arbovirus panel were not able to obtain due to not enough CSF.  Pt has been treated empirically with Valtrex for 4 days. P/ Will try to coordinate care with ID for further recommendations.  For now only monitoring.

## 2012-12-08 NOTE — Progress Notes (Signed)
Family Medicine Office Visit Note   Subjective:   Patient ID: Brittney [name], female  DOB: [birth year], 37 y.o.. MRN: [id number]   Pt that comes today for recent hospitalization follow up. Visit is conducted in [place]. Pt was discharged with diagnosis of Viral Meningitis. This is there third time she presents with similar symptoms and diagnosis. She reports they have happened about the same time in the year and coincidentally every 4 years. She is [place] Her first episode was in [place] the other two here in [place].  She works in [employer] and because  knows about asbestos and lead exposure she reports tries to avoid them. She denies having any pets and also denies any other medical condition, recent travels or sick contacts.   Pt reports that since her discharge from the hospital she is back to self. Denies headache, nausea, vomiting or neck rigidity. She expresses  concern about why does she have this episodic meningitis.   Review of Systems:  Pt denies SOB, chest pain, palpitations, headaches, dizziness, numbness or weakness. No changes on urinary or BM habits. No unintentional weigh loss/gain.  Objective:   Physical Exam: Gen:  NAD HEENT: Moist mucous membranes  CV: Regular rate and rhythm, no murmurs rubs or gallops PULM: Clear to auscultation bilaterally. No wheezes/rales/rhonchi ABD: Soft, non tender, non distended, normal bowel sounds EXT: No edema Neuro: Alert and oriented x3. No focalization.   Assessment & Plan:

## 2012-12-28 ENCOUNTER — Telehealth: Payer: Self-pay | Admitting: *Deleted

## 2012-12-28 NOTE — Telephone Encounter (Signed)
Dr. Aviva Signs,  Patient stopped by and dropped off a letter for you to call her. I have the letter at my desk. Tried to explain that you will not be in office until next week. Just wanted to let you know

## 2013-01-03 ENCOUNTER — Telehealth: Payer: Self-pay | Admitting: Family Medicine

## 2013-01-03 NOTE — Telephone Encounter (Signed)
Called pt and left message. Pt can call our Spanish line a leave more detail message about the reason for calling or can schedule an appointment if needed.

## 2013-01-05 LAB — AFB CULTURE WITH SMEAR (NOT AT ARMC)

## 2013-01-16 ENCOUNTER — Encounter: Payer: No Typology Code available for payment source | Admitting: Obstetrics & Gynecology

## 2013-04-05 ENCOUNTER — Ambulatory Visit (INDEPENDENT_AMBULATORY_CARE_PROVIDER_SITE_OTHER): Payer: No Typology Code available for payment source | Admitting: Obstetrics & Gynecology

## 2013-04-05 ENCOUNTER — Other Ambulatory Visit (HOSPITAL_COMMUNITY)
Admission: RE | Admit: 2013-04-05 | Discharge: 2013-04-05 | Disposition: A | Discharge status: Home / Self Care | Payer: No Typology Code available for payment source | Source: Ambulatory Visit | Attending: Obstetrics & Gynecology | Admitting: Obstetrics & Gynecology

## 2013-04-05 ENCOUNTER — Encounter: Payer: Self-pay | Admitting: Obstetrics & Gynecology

## 2013-04-05 VITALS — BP 118/78 | HR 78 | Temp 98.3°F | Ht 59.75 in | Wt 158.0 lb

## 2013-04-05 DIAGNOSIS — Z01812 Encounter for preprocedural laboratory examination: Secondary | ICD-10-CM

## 2013-04-05 DIAGNOSIS — N926 Irregular menstruation, unspecified: Secondary | ICD-10-CM | POA: Insufficient documentation

## 2013-04-05 LAB — POCT PREGNANCY, URINE: Preg Test, Ur: NEGATIVE

## 2013-04-05 MED ORDER — NORGESTIMATE-ETH ESTRADIOL 0.25-35 MG-MCG PO TABS: 1.0000 | ORAL_TABLET | Freq: Every day | ORAL | Status: DC

## 2013-04-05 NOTE — Progress Notes (Signed)
Subjective:     Patient ID: Brittney [name], female   DOB: May 07, 1976, 37 y.o.   MRN: [id number]  HPI Pt reports irreg cycles.  She reports 3 days of heavy bleeding per cycle followed by 15days of spotting.  She was told that she might have uterine fibroids but, a CT done in the ED was normal.  She was  Treated for a UTI in the ED and her pain improved but, the bleeding persisted.  Review of Systems     Objective:   Physical Exam BP 118/78  Pulse 78  Temp(Src) 98.3 F (36.8 C) (Oral)  Ht 4' 11.75" (1.518 m)  Wt 158 lb (71.668 kg)  BMI 31.1 kg/m2  LMP 04/02/2013  The indications for endometrial biopsy were reviewed.   Risks of the biopsy including cramping, bleeding, infection, uterine perforation, inadequate specimen and need for additional procedures  were discussed. The patient states she understands and agrees to undergo procedure today. Consent was signed. Time out was performed. Urine HCG was negative. A sterile speculum was placed in the patient's vagina and the cervix was prepped with Betadine. A single-toothed tenaculum was placed on the anterior lip of the cervix to stabilize it. The 3 mm pipelle was introduced into the endometrial cavity without difficulty to a depth of 8cm, and a moderate amount of tissue was obtained and sent to pathology. The instruments were removed from the patient's vagina. Minimal bleeding from the cervix was noted. The patient tolerated the procedure well.  11/28/2012 CT ABDOMEN AND PELVIS WITHOUT CONTRAST  Technique: Multidetector CT imaging of the abdomen and pelvis was  performed following the standard protocol without intravenous  contrast.  Comparison: None.  Findings: Lung bases are clear. No pericardial fluid.  No focal hepatic lesion on this noncontrast exam. The gallbladder,  pancreas, spleen, adrenal glands, and kidneys are normal. No  nephrolithiasis or ureterolithiasis. No obstructive uropathy.  The stomach, small bowel, and cecum are normal.  Appendix is not  identified but there are no secondary signs of acute appendicitis.  The colon and rectosigmoid colon are normal.  Abdominal aorta is normal caliber. No retroperitoneal periportal  lymphadenopathy.  No free fluid the pelvis. There is a linear calcific density  within the myometrium of the uterus which appears dystrophic.  Bladder is normal. No pelvic lymphadenopathy.  Review of bone windows demonstrates no aggressive osseous lesions.  IMPRESSION:  1. No nephrolithiasis or ureterolithiasis.  2. No acute abdominal or pelvic findings.      Assessment:     irreg cycles     Plan:     Sprintec 1 po q day F/u in 3 months or sooner prn F/u endobx   Routine post-procedure instructions were given to the patient. The patient will follow up to review the results and for further management.

## 2013-04-05 NOTE — Patient Instructions (Addendum)
Uso de los anticonceptivos orales  (Oral Contraception Use) Los anticonceptivos orales son medicamentos que se utilizan para Location manager. Su funcin es [employer] ovarios liberen vulos. Las hormonas de los anticonceptivos orales hacen que el moco cervical se haga ms espeso, lo que evita que el esperma ingrese al tero. Tambin hacen que la membrana que tapiza el tero se vuelva ms fina, lo que no permite que el huevo fertilizado se adhiera a la pared del tero. Los anticonceptivos orales son muy efectivos cuando se toman exactamente como se prescriben. Sin embargo, los anticonceptivos orales no previenen contra las enfermedades de transmisin sexual (ETS). La prctica del sexo seguro, como el uso de preservativos, junto con los anticonceptivos [place], [place] a prevenir ese tipo de enfermedades. Antes de tomar la pldora, usted debe hacerse un examen fsico y un [place]. El mdico podr indicarle anlisis de [place], si es necesario. El mdico se asegurar de que usted es una buena candidata para usar anticonceptivos orales. Converse con su mdico acerca de los posibles efectos secundarios de los anticonceptivos [place]. Cuando se inicia el uso de anticonceptivos [place], se pueden tomar durante 2 a 3 meses para que el cuerpo se adapte a los cambios en los niveles hormonales en el cuerpo.  CMO TOMAR LOS ANTICONCEPTIVOS ORALES  El mdico le indicar como comenzar a Surveyor, minerals ciclo de anticonceptivos orales. De lo contrario usted puede:   Psychiatric nurse. da del ciclo menstrual. No necesitar proteccin anticonceptiva adicional al Investment banker, operational.  Comenzar Financial risk analyst domingo luego de su perodo menstrual, o Medical laboratory scientific officer en que adquiere el [employer]. En estos casos deber [employer] proteccin anticonceptiva [employer] primeros 7 das del [place]. Luego de comenzar a tomar los anticonceptivos orales:   Si olvid de tomar 1 pldora, tmela tan pronto como lo recuerde. Tome la  siguiente pldora a la hora habitual.  Si olvida tomar 2  ms pldoras, utilice un mtodo anticonceptivo adicional hasta que comience su prximo perodo menstrual.  Si utiliza el envase de 28 pldoras y [place] tomar 1 de las ltimas 7 (pldoras sin hormonas), sto no tiene [place] manager. Simplemente deseche el resto de las pldoras que no contienen hormonas y comience un nuevo envase. No importa cuando comience a tomar los anticonceptivos, siempre empiece un nuevo envase el mismo da de la [place]. Tenga un envase extra de pldoras anticonceptivas y use un mtodo anticonceptivo adicional para el caso en que se olvide de tomar algunas pldoras o pierda la caja.  INSTRUCCIONES PARA EL CUIDADO DOMICILIARIO  No fume.  Siempre use un condn para protegerse de las enfermedades de transmisin sexual. Los anticonceptivos orales no protegen contra las enfermedades de transmisin sexual.  Research scientist (medical) en un calendario las fechas en las que tiene sus perodos [place].  Lea la informacin y consejos que vienen con las pldoras. Pngase en contacto con el mdico siempre que tenga preguntas. SOLICITE ATENCIN MDICA SI:  Presenta nuseas o vmitos.  Tiene flujo o sangrado vaginal anormal.  Aparece una erupcin cutnea.  No tiene el perodo menstrual.  Pierde el cabello.  Necesita tratamiento por cambios en su estado de nimo o por depresin.  Se siente mareada al tomar la pldora.  Comienza a aparecer acn con el uso de los anticonceptivos orales.  Ardelle Anton. SOLICITE ATENCIN MDICA DE INMEDIATO SI:  Siente dolor en el pecho.  Le falta el aire.  Le duele mucho la cabeza y no puede Human resources officer.  Siente adormecimiento o tiene  dificultad para hablar.  Tiene problemas de visin.  Presenta dolor, inflamacin o hinchazn en las piernas. Document Released: 06/25/2011 Document Revised: 09/28/2011 [facility] Patient Information 2014 [place], [place].

## 2013-04-06 LAB — GC/CHLAMYDIA PROBE AMP: GC Probe RNA: NEGATIVE

## 2013-04-07 ENCOUNTER — Telehealth: Payer: Self-pay | Admitting: General Practice

## 2013-04-07 NOTE — Telephone Encounter (Signed)
Message copied by Kathee Delton on Fri Apr 07, 2013  9:10 AM ------      Message from: Willodean Rosenthal      Created: Thu Apr 06, 2013  4:33 PM       Please call pt and notify her that her Endobx is normal.  Keep OCP's and f/u as scheduled.            Thx,      clh-S ------

## 2013-04-07 NOTE — Telephone Encounter (Signed)
Called patient and informed her of results and recommendations. Patient verbalized understanding and had no further questions  

## 2013-04-12 ENCOUNTER — Ambulatory Visit (HOSPITAL_COMMUNITY)
Admission: RE | Admit: 2013-04-12 | Discharge: 2013-04-12 | Disposition: A | Discharge status: Home / Self Care | Payer: No Typology Code available for payment source | Source: Ambulatory Visit | Attending: Obstetrics & Gynecology | Admitting: Obstetrics & Gynecology

## 2013-04-12 DIAGNOSIS — D259 Leiomyoma of uterus, unspecified: Secondary | ICD-10-CM | POA: Insufficient documentation

## 2013-04-12 DIAGNOSIS — N926 Irregular menstruation, unspecified: Secondary | ICD-10-CM | POA: Insufficient documentation

## 2013-04-27 ENCOUNTER — Ambulatory Visit (INDEPENDENT_AMBULATORY_CARE_PROVIDER_SITE_OTHER): Payer: No Typology Code available for payment source | Admitting: Family Medicine

## 2013-04-27 VITALS — BP 113/74 | HR 73 | Temp 99.0°F | Wt 158.0 lb

## 2013-04-27 DIAGNOSIS — F329 Major depressive disorder, single episode, unspecified: Secondary | ICD-10-CM

## 2013-04-27 MED ORDER — FLUOXETINE HCL 20 MG PO TABS: 20.0000 mg | ORAL_TABLET | Freq: Every day | ORAL | Status: DC

## 2013-04-27 NOTE — Progress Notes (Signed)
Family Medicine Office Visit Note   Subjective:   Patient ID: Brittney [name], female  DOB: [birth year], 37 y.o.. MRN: [id number]   Pt that comes today with complains about recurrent headaches. She reports symptoms are 2-3 times per week. Describes her headaches in differents part of her head, sometimes frontal, hemicrania in either side or occipital, to name some. She reports has sensation of something "pulling inside her head". She reports takes ibuprofen or tylenol and her symptoms completely resolve.   She also reports anhedonia, lack of energy, and difficulty falling asleep. She works cleaning houses, and there are days that she reports does not want to go to work. She describes herself as a very active person so this "new feelings" are hard for her. She also reports recent anxiety episodes when driving in the highway. She mentions is used to drive in heavy traffic but it has been quite difficult recently due to fears of bigger vehicles driving by. She stays at home worrying about her symptoms and looking for explanation.  Denies suicidal ideations or plans.   She has PMHx significant for Aseptic Meningitis hospitalized in 11/2012 with negative (HIV antibody, RPR, B burgdorferi, Rocky M, Ehrlichia, Quantiferon gold, CD4 count, IgE, IgG, IgA, IgM and ANA) HSV and Arbovirus were pending to 2 not enough CSF.   Review of Systems:  Pt denies SOB, chest pain, palpitations, headaches, dizziness, numbness or weakness. No changes on urinary or BM habits. No unintentional weigh loss/gain.  Objective:    Physical Exam: Gen:  NAD HEENT: Moist mucous membranes  CV: Regular rate and rhythm, no murmurs rubs or gallops PULM: Clear to auscultation bilaterally. No wheezes/rales/rhonchi ABD: Soft, non tender, non distended, normal bowel sounds EXT: No edema Neuro: Alert and oriented x3. No focalization Psych: Depressed mood and affect. Normal speech. Normal thought process. No agitation. No hallucinations.    Assessment & Plan:

## 2013-04-28 DIAGNOSIS — F32A Depression, unspecified: Secondary | ICD-10-CM | POA: Insufficient documentation

## 2013-04-28 DIAGNOSIS — F329 Major depressive disorder, single episode, unspecified: Secondary | ICD-10-CM | POA: Insufficient documentation

## 2013-04-28 NOTE — Assessment & Plan Note (Signed)
Moderated depression. Agreeable to start SSRI.  Will start Prozac and re-evaluate in 2 weeks.

## 2013-04-28 NOTE — Patient Instructions (Signed)
Depresión en el adulto   (Depression, Adult)  La depresión es un sentimiento de tristeza, decaimiento, sufrimiento espiritual o vacío. Hay dos tipos de depresión:   1. La depresión que todos experimentamos de tanto en tanto debido a experiencias de la vida inquietantes, como la pérdida del trabajo o el final de una relación (tristeza normal o duelo normal). Este tipo de depresión se considera normal, es de corta duración y se resuelve entre unos pocos días y 2 semanas. (La depresión que se experimenta tras la pérdida de un ser querido se llama duelo. El duelo en general dura más de 2 semanas, pero normalmente mejora con el tiempo).  2. La depresión clínica, es la que dura más que la tristeza o duelo normal o la que interfiere con su capacidad de desenvolverse en el hogar, en el trabajo y en la escuela. También interfiere en las relaciones personales. Afecta casi todos los aspectos de la vida. La depresión clínica es una enfermedad.  Los síntomas de depresión pueden tener su origen en otras afecciones que no sean la tristeza y el duelo o la depresión clínica. Ejemplos de estas afecciones son:   · Enfermedades físicas: Algunas enfermedades físicas, incluyendo poca actividad de la glándula tiroides (hipotiroidismo), anemia grave, ciertos tipos de cáncer, diabetes, convulsiones incontrolables, problemas cardíacos y pulmonares, ictus y el dolor crónico se asocian con síntomas de depresión.  · Efectos secundarios de algún medicamento recetado: En algunas personas, ciertos tipos de medicamentos recetados pueden causar síntomas de depresión.  · Abuso de sustancias: El abuso de alcohol y drogas puede causar síntomas de depresión.  SÍNTOMAS  Los síntomas de tristeza y duelo normal son:   · Sentirse triste o llorar durante períodos cortos de tiempo.  · Falta de preocupación por todo (apatía).  · Dificultad para dormir o dormir demasiado.  · No poder disfrutar de las cosas que antes disfrutaba.  · El deseo de estar solo todo el  tiempo (aislamiento social).  · Falta de energía o motivación.  · Dificultad para concentrarse o recordar.  · Cambios en el apetito o en el peso.  · Inquietud o agitación.  Los síntomas de la depresión clínica son los mismos de la tristeza o duelo normal y también incluyen los siguientes síntomas:   · Sentirse triste o llorar todo el tiempo.  · Sentimientos de culpa o inutilidad.  · Sentimientos de desesperanza o desamparo.  · Pensamientos de suicidio o el deseo de dañarse a sí mismo (ideas suicidas).  · Pérdida de contacto con la realidad (síntomas psicóticos). Ver o escuchar cosas que no son reales (alucinaciones)o tener creencias falsas acerca de su vida o de las personas que lo rodean (delirios y paranoia).  DIAGNÓSTICO   El diagnóstico de la depresión clínica se basa en la gravedad y duración de los síntomas. El médico le hará preguntas sobre su historia clínica y el uso de sustancias para determinar si una enfermedad física, el uso de medicamentos recetados, o el abuso de sustancias es la causa de su depresión. Su médico también puede indicar análisis de sangre.   TRATAMIENTO   Por lo general, la tristeza y el duelo normal no requieren tratamiento. Pero a veces se recetan antidepresivos durante el duelo para aliviar los síntomas de depresión hasta que se resuelven.   El tratamiento para la depresión clínica depende de la gravedad de los síntomas, pero suele incluir antidepresivos, psicoterapia con un profesional de la salud mental o una combinación de ambos. El médico le ayudará a determinar   qué tratamiento es el mejor para usted.   La depresión causada por una enfermedad física generalmente desaparece con tratamiento médico adecuado de la enfermedad. Si un medicamento recetado le causa depresión, hable con su médico acerca de suspenderlo, disminuir la dosis o sustituirlo por otro medicamento.   La depresión causada por el abuso de alcohol o abuso de drogas ilícitas se va con la abstinencia de estas  sustancias. Algunos adultos necesitan ayuda profesional con el fin de dejar de beber o usar drogas.   SOLICITE ATENCIÓN MÉDICA DE INMEDIATO SI:   · Tiene pensamientos acerca de lastimarse o dañar a otras personas.  · Pierde el contacto con la realidad (tiene síntomas psicóticos).  · Está tomando medicamentos para la depresión y tiene efectos colaterales graves.  PARA OBTENER MÁS INFORMACIÓN  National Alliance on Mental Illness: www.nami.org    National Institute of Mental Health: www.nimh.nih.gov    Document Released: 07/06/2005 Document Revised: 01/05/2012  ExitCare® Patient Information ©2014 ExitCare[employer] LLC.

## 2013-05-01 ENCOUNTER — Other Ambulatory Visit: Payer: Self-pay | Admitting: Family Medicine

## 2013-05-01 MED ORDER — FLUOXETINE HCL 20 MG PO CAPS: 20.0000 mg | ORAL_CAPSULE | Freq: Every day | ORAL | Status: DC

## 2013-05-17 ENCOUNTER — Encounter: Payer: Self-pay | Admitting: *Deleted

## 2013-05-26 ENCOUNTER — Encounter: Payer: Self-pay | Admitting: Family Medicine

## 2013-05-26 ENCOUNTER — Ambulatory Visit (INDEPENDENT_AMBULATORY_CARE_PROVIDER_SITE_OTHER): Payer: No Typology Code available for payment source | Admitting: Family Medicine

## 2013-05-26 VITALS — BP 114/66 | HR 72 | Temp 99.0°F | Ht 61.5 in | Wt 158.0 lb

## 2013-05-26 DIAGNOSIS — F329 Major depressive disorder, single episode, unspecified: Secondary | ICD-10-CM

## 2013-05-26 LAB — CBC
Hemoglobin: 13.3 g/dL (ref 12.0–15.0)
MCHC: 34 g/dL (ref 30.0–36.0)
Platelets: 274 10*3/uL (ref 150–400)

## 2013-05-26 LAB — TSH: TSH: 0.816 u[IU]/mL (ref 0.350–4.500)

## 2013-05-26 LAB — BASIC METABOLIC PANEL
Glucose, Bld: 79 mg/dL (ref 70–99)
Potassium: 4.1 mEq/L (ref 3.5–5.3)
Sodium: 141 mEq/L (ref 135–145)

## 2013-05-26 NOTE — Assessment & Plan Note (Signed)
Depression is controlled. This has positively affected her other systemic symptoms. No side effects noted. Will obtain TSH, CBC, BMET. Continue therapy with no change in dosage. F/u in 3 months.

## 2013-05-26 NOTE — Patient Instructions (Addendum)
Me alegro [employer] la medicina te [place]. tomala por 6 meses. haz tu proxima cita de seguimiento en 3 meses.  - Yo le comunicare si los resultados de sus analisis estan [place], de lo contrario lo conversaremos en su proxima cita.

## 2013-05-26 NOTE — Progress Notes (Signed)
Family Medicine Office Visit Note   Subjective:   Patient ID: Brittney [name], female  DOB: 07/31/1975, 36 y.o.. MRN: [id number]   Pt that comes today for follow up. She was started on Prozac 4 weeks ago for moderated depression. She reports feeling better and her headaches have resolved. She reports has had a lot of family issues but has been able to cope with them. Pt denies noticeable side effects of medication and desires to continue this therapy.    Review of Systems:  Pt denies SOB, chest pain, palpitations, headaches, dizziness, numbness or weakness. No changes on urinary or BM habits. No unintentional weigh loss/gain.  Objective:   Physical Exam: Gen:  NAD HEENT: Moist mucous membranes  CV: Regular rate and rhythm, no murmurs rubs or gallops PULM: Clear to auscultation bilaterally. No wheezes/rales/rhonchi ABD: Soft, non tender, non distended, normal bowel sounds EXT: No edema Neuro: Alert and oriented x3. No focalization Psych: normal mood and affect, normal speech, no agitation.   Assessment & Plan:

## 2013-05-29 ENCOUNTER — Encounter: Payer: Self-pay | Admitting: Family Medicine

## 2013-06-01 ENCOUNTER — Ambulatory Visit (INDEPENDENT_AMBULATORY_CARE_PROVIDER_SITE_OTHER): Payer: No Typology Code available for payment source | Admitting: Family Medicine

## 2013-06-01 ENCOUNTER — Encounter: Payer: Self-pay | Admitting: Family Medicine

## 2013-06-01 VITALS — BP 110/76 | HR 84 | Temp 98.0°F | Wt 159.0 lb

## 2013-06-01 DIAGNOSIS — L909 Atrophic disorder of skin, unspecified: Secondary | ICD-10-CM

## 2013-06-01 DIAGNOSIS — L918 Other hypertrophic disorders of the skin: Secondary | ICD-10-CM | POA: Insufficient documentation

## 2013-06-01 MED ORDER — BACITRACIN 500 UNIT/GM EX OINT: 1.0000 "application " | TOPICAL_OINTMENT | Freq: Two times a day (BID) | CUTANEOUS | Status: DC

## 2013-06-01 NOTE — Progress Notes (Signed)
Family Medicine Office Visit Note   Subjective:   Patient ID: Brittney [name], female  DOB: March 15, 1976, 37 y.o.. MRN: [id number]   Pt that comes today for skin tag removal.   Objective:   Physical Exam: Gen:  NAD HEENT: Moist mucous membranes Skin: two small skin tags present on right side of neck. No surrounded erythema or  edema.  No adenopathies. Neck supple.  CV: Regular rate and rhythm, no murmurs PULM: Clear to auscultation bilaterally.   Assessment & Plan:    Skin Tag Removal Procedure Note  Pre-operative Diagnosis: Classic skin tags (acrochordon)  Post-operative Diagnosis: Classic skin tags (acrochordon)  Locations:lateral, right neck  Procedure Details  The risks (including bleeding and infection) and benefits of the procedure and Written informed consent obtained. Using sterile iris scissors, multiple skin tags were snipped off at their bases after cleansing with Betadine.  Bleeding was controlled by pressure and silver nitrate was dabbed in affected area after removal.  Findings: Pathognomonic benign lesions  not sent for pathological exam.  Condition: Stable  Complications: none.  Plan: 1. Instructed to keep the wounds dry and covered for 24-48h and clean thereafter. 2. Warning signs of infection were reviewed.   3. Return as needed.

## 2013-06-01 NOTE — Patient Instructions (Signed)
Puedes quitarte el vendage hoy en la tarde. Aplica el unguentp recetado y despues lava cona agua y [place]. si tuvieses enojecimento, dolor o hichazon en el area necesita verse de nuevo.

## 2013-08-25 ENCOUNTER — Encounter: Payer: Self-pay | Admitting: Family Medicine

## 2013-08-25 ENCOUNTER — Ambulatory Visit (INDEPENDENT_AMBULATORY_CARE_PROVIDER_SITE_OTHER): Payer: No Typology Code available for payment source | Admitting: Family Medicine

## 2013-08-25 VITALS — BP 129/69 | HR 76 | Temp 99.1°F | Ht 61.5 in | Wt 162.0 lb

## 2013-08-25 DIAGNOSIS — F329 Major depressive disorder, single episode, unspecified: Secondary | ICD-10-CM

## 2013-08-25 DIAGNOSIS — F32A Depression, unspecified: Secondary | ICD-10-CM

## 2013-08-25 DIAGNOSIS — F3289 Other specified depressive episodes: Secondary | ICD-10-CM

## 2013-08-25 DIAGNOSIS — Z23 Encounter for immunization: Secondary | ICD-10-CM

## 2013-08-25 NOTE — Progress Notes (Signed)
Family Medicine Office Visit Note   Subjective:   Patient ID: Brittney [name], female  DOB: 1975/10/21, 38 y.o.. MRN: [id number]   Pt that comes today to f/u her present therapy with Prozac. Pt reports is feeling better. Denies headaches and her mood has improved. She comes accompanied by her new partner. This relationship started recently after years of being living alone with son. She denies noticeable side effects of medication and desires to continue tx since she stopped it for a couple of days and experienced headaches again that completely resolved after resuming tx.   Review of Systems:  Pt denies SOB, chest pain, palpitations, headaches, dizziness, numbness or weakness. No changes on urinary or BM habits. No unintentional weigh loss/gain.  Objective:   Physical Exam: Gen:  NAD HEENT: Moist mucous membranes  CV: Regular rate and rhythm, no murmurs rubs or gallops PULM: Clear to auscultation bilaterally. No wheezes/rales/rhonchi EXT: No edema Neuro: Alert and oriented x3. No focalization Psych: normal mood and affect. Normal speech. Normal though process. No hallucinations/ delusions. No agitation. No suicidal thought or plans.  Assessment & Plan:

## 2013-08-26 MED ORDER — FLUOXETINE HCL 20 MG PO CAPS: 20.0000 mg | ORAL_CAPSULE | Freq: Every day | ORAL | Status: DC

## 2013-08-26 NOTE — Assessment & Plan Note (Signed)
Controlled on prozac. No change in therapy.  F/u in 2-3 months

## 2013-08-26 NOTE — Patient Instructions (Signed)
Continua con el mismo tratamiento. Su proxima visita es en 2-3 meses o antes si lo necesita.

## 2013-10-19 ENCOUNTER — Emergency Department (HOSPITAL_COMMUNITY)
Admission: EM | Admit: 2013-10-19 | Discharge: 2013-10-20 | Disposition: A | Discharge status: Home / Self Care | Payer: No Typology Code available for payment source | Attending: Emergency Medicine | Admitting: Emergency Medicine

## 2013-10-19 ENCOUNTER — Encounter (HOSPITAL_COMMUNITY): Payer: Self-pay | Admitting: Emergency Medicine

## 2013-10-19 DIAGNOSIS — Z792 Long term (current) use of antibiotics: Secondary | ICD-10-CM | POA: Insufficient documentation

## 2013-10-19 DIAGNOSIS — Z8669 Personal history of other diseases of the nervous system and sense organs: Secondary | ICD-10-CM | POA: Insufficient documentation

## 2013-10-19 DIAGNOSIS — R197 Diarrhea, unspecified: Secondary | ICD-10-CM | POA: Insufficient documentation

## 2013-10-19 DIAGNOSIS — M549 Dorsalgia, unspecified: Secondary | ICD-10-CM

## 2013-10-19 DIAGNOSIS — M545 Low back pain, unspecified: Secondary | ICD-10-CM | POA: Insufficient documentation

## 2013-10-19 DIAGNOSIS — R5381 Other malaise: Secondary | ICD-10-CM | POA: Insufficient documentation

## 2013-10-19 DIAGNOSIS — Z79899 Other long term (current) drug therapy: Secondary | ICD-10-CM | POA: Insufficient documentation

## 2013-10-19 DIAGNOSIS — R509 Fever, unspecified: Secondary | ICD-10-CM | POA: Insufficient documentation

## 2013-10-19 DIAGNOSIS — Z3202 Encounter for pregnancy test, result negative: Secondary | ICD-10-CM | POA: Insufficient documentation

## 2013-10-19 DIAGNOSIS — R5383 Other fatigue: Secondary | ICD-10-CM

## 2013-10-19 DIAGNOSIS — R110 Nausea: Secondary | ICD-10-CM | POA: Insufficient documentation

## 2013-10-19 LAB — COMPREHENSIVE METABOLIC PANEL
ALBUMIN: 3.6 g/dL (ref 3.5–5.2)
ALT: 28 U/L (ref 0–35)
AST: 21 U/L (ref 0–37)
Alkaline Phosphatase: 63 U/L (ref 39–117)
BUN: 11 mg/dL (ref 6–23)
CALCIUM: 8.9 mg/dL (ref 8.4–10.5)
CO2: 18 mEq/L — ABNORMAL LOW (ref 19–32)
Chloride: 104 mEq/L (ref 96–112)
Creatinine, Ser: 0.51 mg/dL (ref 0.50–1.10)
GFR calc non Af Amer: >90 mL/min (ref >90)
GLUCOSE: 128 mg/dL — AB (ref 70–99)
Potassium: 3.3 mEq/L — ABNORMAL LOW (ref 3.7–5.3)
Sodium: 138 mEq/L (ref 137–147)
TOTAL PROTEIN: 7.1 g/dL (ref 6.0–8.3)
Total Bilirubin: 0.3 mg/dL (ref 0.3–1.2)

## 2013-10-19 LAB — CBC WITH DIFFERENTIAL/PLATELET
BASOS ABS: 0 10*3/uL (ref 0.0–0.1)
Basophils Relative: 0 % (ref 0–1)
Eosinophils Absolute: 0 10*3/uL (ref 0.0–0.7)
Eosinophils Relative: 0 % (ref 0–5)
HEMATOCRIT: 38.9 % (ref 36.0–46.0)
HEMOGLOBIN: 13.5 g/dL (ref 12.0–15.0)
LYMPHS ABS: 1 10*3/uL (ref 0.7–4.0)
LYMPHS PCT: 19 % (ref 12–46)
MCH: 31.2 pg (ref 26.0–34.0)
MCHC: 34.7 g/dL (ref 30.0–36.0)
MCV: 89.8 fL (ref 78.0–100.0)
MONO ABS: 0.3 10*3/uL (ref 0.1–1.0)
MONOS PCT: 5 % (ref 3–12)
NEUTROS ABS: 4 10*3/uL (ref 1.7–7.7)
Neutrophils Relative %: 76 % (ref 43–77)
Platelets: 216 10*3/uL (ref 150–400)
RBC: 4.33 MIL/uL (ref 3.87–5.11)
RDW: 13.6 % (ref 11.5–15.5)
WBC: 5.3 10*3/uL (ref 4.0–10.5)

## 2013-10-19 LAB — URINALYSIS, ROUTINE W REFLEX MICROSCOPIC
GLUCOSE, UA: NEGATIVE mg/dL
Ketones, ur: 15 mg/dL — AB
Leukocytes, UA: NEGATIVE
NITRITE: NEGATIVE
PH: 5.5 (ref 5.0–8.0)
Protein, ur: NEGATIVE mg/dL
SPECIFIC GRAVITY, URINE: 1.026 (ref 1.005–1.030)
Urobilinogen, UA: 0.2 mg/dL (ref 0.0–1.0)

## 2013-10-19 LAB — URINE MICROSCOPIC-ADD ON

## 2013-10-19 MED ORDER — ONDANSETRON HCL 4 MG/2ML IJ SOLN
4.0000 mg | Freq: Once | INTRAMUSCULAR | Status: AC
  Administered 2013-10-19: 4 mg via INTRAVENOUS
  Filled 2013-10-19: qty 2

## 2013-10-19 MED ORDER — IOHEXOL 300 MG/ML  SOLN
25.0000 mL | Freq: Once | INTRAMUSCULAR | Status: AC | PRN
  Administered 2013-10-19: 25 mL via ORAL

## 2013-10-19 MED ORDER — MORPHINE SULFATE 4 MG/ML IJ SOLN
4.0000 mg | Freq: Once | INTRAMUSCULAR | Status: AC
  Administered 2013-10-19: 4 mg via INTRAVENOUS
  Filled 2013-10-19: qty 1

## 2013-10-19 MED ORDER — SODIUM CHLORIDE 0.9 % IV BOLUS (SEPSIS)
500.0000 mL | Freq: Once | INTRAVENOUS | Status: AC
  Administered 2013-10-19: 500 mL via INTRAVENOUS

## 2013-10-19 NOTE — ED Notes (Signed)
Pt reports mid and lower back pain x 3 days with diarrhea, headache, nausea, fatigue. Denies any urinary symptoms. Temp 100.1 at triage. Mask on pt at triage.

## 2013-10-20 ENCOUNTER — Emergency Department (HOSPITAL_COMMUNITY): Payer: No Typology Code available for payment source

## 2013-10-20 ENCOUNTER — Encounter (HOSPITAL_COMMUNITY): Payer: Self-pay | Admitting: Radiology

## 2013-10-20 LAB — PREGNANCY, URINE: PREG TEST UR: NEGATIVE

## 2013-10-20 MED ORDER — NAPROXEN 500 MG PO TABS: 500.0000 mg | ORAL_TABLET | Freq: Two times a day (BID) | ORAL | Status: DC

## 2013-10-20 MED ORDER — METHOCARBAMOL 500 MG PO TABS: 500.0000 mg | ORAL_TABLET | Freq: Two times a day (BID) | ORAL | Status: DC | PRN

## 2013-10-20 MED ORDER — IOHEXOL 300 MG/ML  SOLN
100.0000 mL | Freq: Once | INTRAMUSCULAR | Status: AC | PRN
  Administered 2013-10-20: 100 mL via INTRAVENOUS

## 2013-10-20 NOTE — ED Provider Notes (Signed)
Pt received at change of shift - cT neg for acute findings - pt informed of resutls - appears stable for d/c.    Brittney Acosta, MD 10/20/13 [phone]

## 2013-10-20 NOTE — ED Notes (Signed)
Patient transported to CT 

## 2013-10-20 NOTE — ED Provider Notes (Signed)
CSN: [id number]     Arrival date & time 10/19/13  1659 History   First MD Initiated Contact with Patient 10/19/13 2120     Chief Complaint  Patient presents with  . Back Pain  . Fever     (Consider location/radiation/quality/duration/timing/severity/associated sxs/prior Treatment) HPI Comments: Patient presents to the ER for evaluation of diffuse back pain for 3 days. Patient has had headache, nausea, fatigue, low-grade fever. She denies urinary symptoms. She has had diarrhea, nausea but no vomiting. Pain is achy, diffuse and constant.  Patient is a 38 y.o. female presenting with back pain and fever.  Back Pain Associated symptoms: fever   Associated symptoms: no abdominal pain and no chest pain   Fever Associated symptoms: diarrhea and nausea   Associated symptoms: no chest pain, no cough and no vomiting     Past Medical History  Diagnosis Date  . Meningitis 2010, 2007   Past Surgical History  Procedure Laterality Date  . Cesarean section     History reviewed. No pertinent family history. History  Substance Use Topics  . Smoking status: Never Smoker   . Smokeless tobacco: Never Used  . Alcohol Use: No   OB History   Grav Para Term Preterm Abortions TAB SAB Ect Mult Living   5 4 3 1 1  0 1 0 0 0     Review of Systems  Constitutional: Positive for fever.  Respiratory: Negative for cough.   Cardiovascular: Negative for chest pain.  Gastrointestinal: Positive for nausea and diarrhea. Negative for vomiting and abdominal pain.  Genitourinary: Negative.   Musculoskeletal: Positive for back pain.  All other systems reviewed and are negative.      Allergies  Review of patient's allergies indicates no known allergies.  Home Medications   Current Outpatient Rx  Name  Route  Sig  Dispense  Refill  . bacitracin 500 UNIT/GM ointment   Topical   Apply 1 application topically 2 (two) times daily.   15 g   0   . FLUoxetine (PROZAC) 20 MG capsule   Oral   Take 1  capsule (20 mg total) by mouth daily.   30 capsule   6   . ibuprofen (ADVIL,MOTRIN) 200 MG tablet   Oral   Take 600 mg by mouth every 6 (six) hours as needed for pain. For pain          BP 100/65  Pulse 64  Temp(Src) 100 F (37.8 C)  Resp 18  Ht 5\' 2"  (1.575 m)  Wt 154 lb (69.854 kg)  BMI 28.16 kg/m2  SpO2 99%  LMP 10/12/2013 Physical Exam  Constitutional: She is oriented to person, place, and time. She appears well-developed and well-nourished. No distress.  HENT:  Head: Normocephalic and atraumatic.  Right Ear: Hearing normal.  Left Ear: Hearing normal.  Nose: Nose normal.  Mouth/Throat: Oropharynx is clear and moist and mucous membranes are normal.  Eyes: Conjunctivae and EOM are normal. Pupils are equal, round, and reactive to light.  Neck: Normal range of motion. Neck supple.  Cardiovascular: Regular rhythm, S1 normal and S2 normal.  Exam reveals no gallop and no friction rub.   No murmur heard. Pulmonary/Chest: Effort normal and breath sounds normal. No respiratory distress. She exhibits no tenderness.  Abdominal: Soft. Normal appearance and bowel sounds are normal. There is no hepatosplenomegaly. There is no tenderness. There is no rebound, no guarding, no tenderness at McBurney's point and negative Murphy's sign. No hernia.  Musculoskeletal: Normal range of motion.  Arms: Neurological: She is alert and oriented to person, place, and time. She has normal strength. No cranial nerve deficit or sensory deficit. Coordination normal. GCS eye subscore is 4. GCS verbal subscore is 5. GCS motor subscore is 6.  Skin: Skin is warm, dry and intact. No rash noted. No cyanosis.  Psychiatric: She has a normal mood and affect. Her speech is normal and behavior is normal. Thought content normal.    ED Course  Procedures (including critical care time) Labs Review Labs Reviewed  COMPREHENSIVE METABOLIC PANEL - Abnormal; Notable for the following:    Potassium 3.3 (*)    CO2  18 (*)    Glucose, Bld 128 (*)    All other components within normal limits  URINALYSIS, ROUTINE W REFLEX MICROSCOPIC - Abnormal; Notable for the following:    APPearance CLOUDY (*)    Hgb urine dipstick TRACE (*)    Bilirubin Urine SMALL (*)    Ketones, ur 15 (*)    All other components within normal limits  URINE MICROSCOPIC-ADD ON - Abnormal; Notable for the following:    Squamous Epithelial / LPF MANY (*)    All other components within normal limits  CBC WITH DIFFERENTIAL  PREGNANCY, URINE  POC URINE PREG, ED   Imaging Review No results found.   EKG Interpretation None      MDM   Final diagnoses:  Back pain   PAtient with back pain, musculo-skeletal by exam. Normal neuro exam. Does have low grade fever. Pain likely myalgia. CT scan to further evaluate, signed out to Dr. Sabra Heck to follow up CT.    Orpah Greek, MD 10/21/13 [phone]

## 2013-11-03 ENCOUNTER — Ambulatory Visit: Payer: No Typology Code available for payment source | Admitting: Family Medicine

## 2013-11-07 ENCOUNTER — Encounter: Payer: Self-pay | Admitting: Family Medicine

## 2013-11-07 ENCOUNTER — Ambulatory Visit (INDEPENDENT_AMBULATORY_CARE_PROVIDER_SITE_OTHER): Payer: No Typology Code available for payment source | Admitting: Family Medicine

## 2013-11-07 VITALS — BP 122/71 | HR 86 | Temp 99.4°F | Ht 62.0 in | Wt 159.0 lb

## 2013-11-07 DIAGNOSIS — R519 Headache, unspecified: Secondary | ICD-10-CM | POA: Insufficient documentation

## 2013-11-07 DIAGNOSIS — R51 Headache: Secondary | ICD-10-CM

## 2013-11-07 DIAGNOSIS — G8929 Other chronic pain: Secondary | ICD-10-CM

## 2013-11-07 MED ORDER — TRAMADOL HCL 50 MG PO TABS: 50.0000 mg | ORAL_TABLET | Freq: Three times a day (TID) | ORAL | Status: DC | PRN

## 2013-11-07 MED ORDER — PROPRANOLOL HCL 10 MG PO TABS: 10.0000 mg | ORAL_TABLET | Freq: Two times a day (BID) | ORAL | Status: DC

## 2013-11-07 MED ORDER — PROPRANOLOL HCL 10 MG PO TABS: 10.0000 mg | ORAL_TABLET | Freq: Three times a day (TID) | ORAL | Status: DC

## 2013-11-07 NOTE — Assessment & Plan Note (Signed)
Hx of aseptic meningitis with no identifiable etiology and negative workup in the past. Per pattern and with negative imaging migraine is a possibility. No neurologic signs on this visit that are indication for further work up at this time. -restart Prozac -propranolol for prophylaxis -treatment for crisis with NSAIDs and in last instance tramadol F/u in 4 weeks or sooner if needed. Will consider referral for neurology if this treatment does not seem to help pt's symptoms.

## 2013-11-07 NOTE — Progress Notes (Signed)
Family Medicine Office Visit Note   Subjective:   Patient ID: Brittney [name], female  DOB: [birth year], 38 y.o.. MRN: [id number]   Pt that comes today complaining of intermittent HA in the past month. She reports has been under a lot of stress and has not been taking her Prozac (needs to pick up the prescription and has been very busy) she reports had two visits to ED one at [facility] and the other at [facility] for similar complaints. Pt states CT scan done at [facility] was negative and after migraine cocktail she was discharged home with notable improvement of her symptoms.  Today she is HA free but her episodes seem to be happening more than 2 a week. Located on the "middle of the head", pain is described as stabing and sometimes pulsatile, accompanied of nausea without vomiting and photophobia. It improves after taking NSAIDs but takes a while (days) to completely resolve. No fever, chills, neck stiffness, or focalized weakness, numbness or sensory abnormalities.   Review of Systems:  Pt denies SOB, chest pain, palpitations, dizziness No changes on urinary or BM habits. No unintentional weigh loss/gain.  Objective:   Physical Exam: Gen:  NAD HEENT: Moist mucous membranes. Neck is supple and no adenopathies are palpated.  CV: Regular rate and rhythm, no murmurs rubs or gallops PULM: Clear to auscultation bilaterally. No wheezes/rales/rhonchi ABD: Soft, non tender, non distended, normal bowel sounds EXT: No edema Neuro: Alert and oriented x3. No focalization. CN II-XII are intact. 5/5 strength in all 4 extremities. Sensation is intact and reflexes are 2/2 and symmetric. No clonus, normal gait.   Assessment & Plan:

## 2013-11-07 NOTE — Patient Instructions (Signed)
Cefalea migraosa (Migraine Headache) Una cefalea migraosa es un dolor intenso y punzante en uno o ambos lados de la cabeza. La migraa puede durar desde 30 minutos hasta varias horas/dias. CAUSAS  No siempre se conoce la causa exacta de la cefalea migraosa. Sin embargo, Production assistant, radio [employer] nervios del cerebro se irritan y liberan ciertas sustancias qumicas que causan inflamacin. Esto ocasiona dolor. Existen tambin ciertos factores que pueden desencadenar las migraas, como los siguientes:  Alcohol.  Fumar.  Estrs.  La menstruacin  Quesos aejados.  Los alimentos o las bebidas que contienen nitratos, glutamato, aspartamo o tiramina.  Falta de sueo.  Chocolate.  Cafena.  Hambre.  Actividad fsica extenuante.  Fatiga.  Medicamentos que se usan para tratar Research officer, political party (nitroglicerina), pldoras anticonceptivas, estrgeno y algunos medicamentos para la hipertensin arterial. SIGNOS Y SNTOMAS  Dolor en uno o ambos lados de la cabeza.  Dolor pulsante o punzante.  Dolor intenso que impide [employer] actividades diarias.  Dolor que se agrava por cualquier actividad fsica.  Nuseas, vmitos o ambos.  Mareos.  Dolor con la exposicin a las luces brillantes, a los ruidos fuertes o la [place].  Sensibilidad general a las luces brillantes, a los ruidos fuertes o a los [employer] Antes de sufrir una migraa, puede recibir seales de advertencia (aura). Un aura puede incluir:  Ver las luces intermitentes.  Ver puntos brillantes, halos o lneas en zigzag.  Tener una visin en tnel o visin borrosa.  Sensacin de entumecimiento u hormigueo.  Dificultad para hablar  Debilidad muscular. DIAGNSTICO  La cefalea migraosa se diagnostica en funcin de lo siguiente:  Sntomas.  Examen fsico.  Ardelia Mems TC (tomografa computada) o resonancia magntica de la cabeza. Estas pruebas de diagnstico por imagen no pueden diagnosticar las migraas, pero  pueden ayudar a Paramedic otras causas de las cefaleas. TRATAMIENTO Le prescribirn medicamentos para Best boy y las nuseas. Tambin podrn administrarse medicamentos para ayudar a Investment banker, corporate.  INSTRUCCIONES PARA EL CUIDADO EN EL HOGAR  Slo tome medicamentos de venta libre o recetados para Glass blower/designer o Health and safety inspector, segn las indicaciones de su mdico. No se recomienda usar los opiceos a Barrister's clerk.  Cuando tenga la migraa, acustese en un cuarto oscuro y tranquilo  Lleve un registro diario para Neurosurgeon lo que puede provocar las Psychologist, occupational. Por ejemplo, escriba:  Lo que usted come y bebe.  Cunto tiempo duerme.  Algn cambio en su dieta o en los medicamentos.  Limite el consumo de bebidas alcohlicas.  Si fuma, deje de hacerlo.  Duerma entre 7 y 11horas, o segn las recomendaciones del mdico.  Limite el estrs.  [place] luces tenues si le [employer] luces brillantes y la [place]. SOLICITE ATENCIN MDICA DE INMEDIATO SI:   La migraa se hace cada vez ms intensa.  Tiene fiebre.  Presenta rigidez en el cuello.  Tiene prdida de visin.  Presenta debilidad muscular o prdida del control muscular.  Comienza a perder el equilibrio o tiene problemas para caminar.  Sufre mareos o se desmaya.  Tiene sntomas graves que son diferentes a los primeros sntomas. ASEGRESE DE QUE:   Comprende estas instrucciones.  Controlar su afeccin.  Recibir ayuda de inmediato si no mejora o si empeora. Document Released: 07/06/2005 Document Revised: 04/26/2013 [facility] Patient Information 2014 [place], [place].  Empieza a tomar propranolol como esta indicadoen la receta si tienes dolo de cabeza mas de 2 veces en la semana. Para el dolor agudo  empieza a tomar naproxen y si esto no resuelve puedes tomar el tramadol.  haz un a cita de seguimiento si este plan no funciona para ti.

## 2014-05-21 ENCOUNTER — Encounter: Payer: Self-pay | Admitting: Family Medicine

## 2014-09-21 IMAGING — CT CT ABD-PELV W/ CM
2 of 4 series · 17 of 46 positions shown, 19 images · IV contrast (CONTRAST)
Comparison: Pelvic ultrasound performed 04/12/2013, and CT of the
abdomen and pelvis performed 11/28/2012

CLINICAL DATA: Mid and lower back pain for 3 days. Diarrhea,
headache, nausea and fatigue.

EXAM:
CT ABDOMEN AND PELVIS WITH CONTRAST
TECHNIQUE: Multidetector CT imaging of the abdomen and pelvis was performed
using the standard protocol following bolus administration of
intravenous contrast.
CONTRAST:  100mL OMNIPAQUE IOHEXOL 300 MG/ML  SOLN

[Series 2: routine · axial · 0.76mm/px · z∈[+92,+517]mm · 14 of 93 slices shown, 16 images]
[im 4/93  soft-tissue]
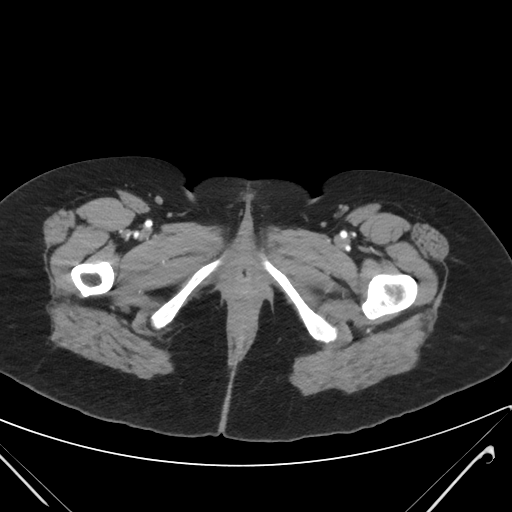
[im 4/93  bone]
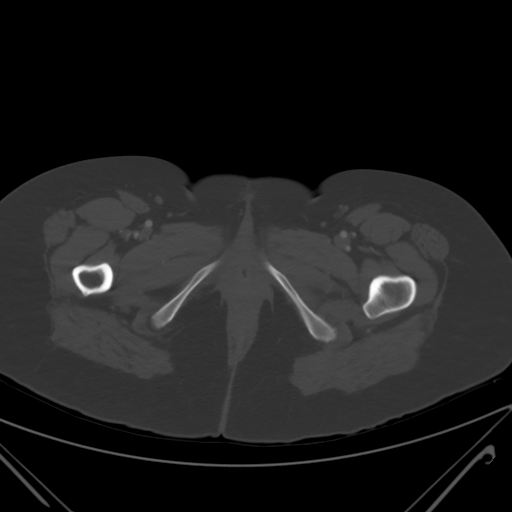
[im 11/93  soft-tissue]
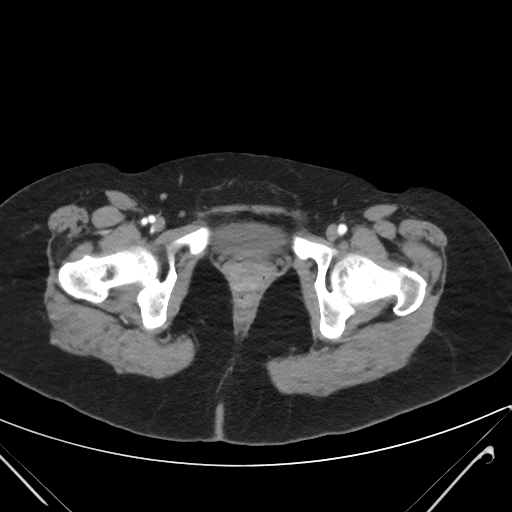
[im 18/93  soft-tissue]
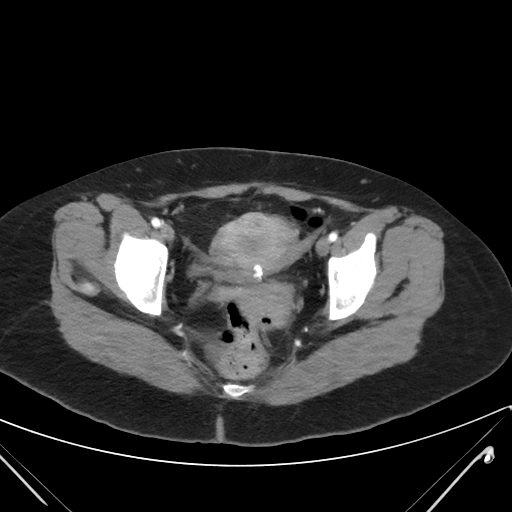
[im 25/93  soft-tissue]
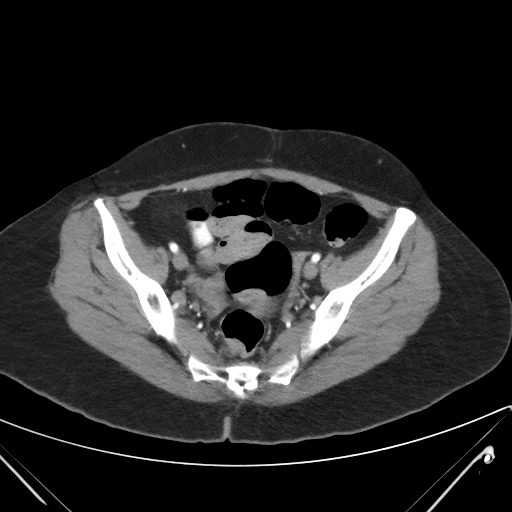
[im 32/93  soft-tissue]
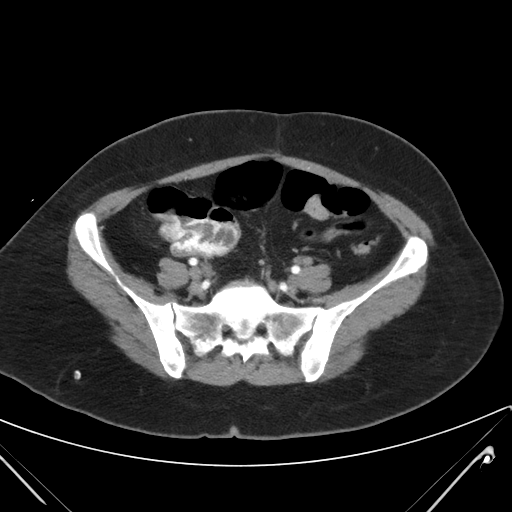
[im 36/93  soft-tissue]
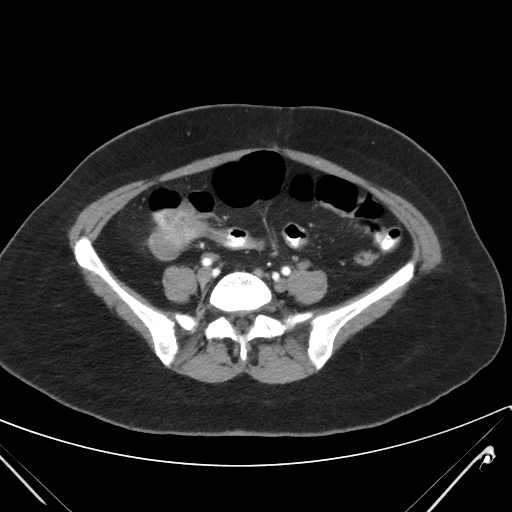
[im 43/93  soft-tissue]
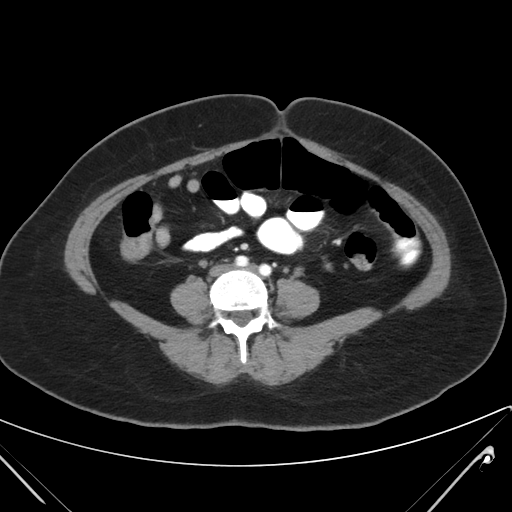
[im 50/93  soft-tissue]
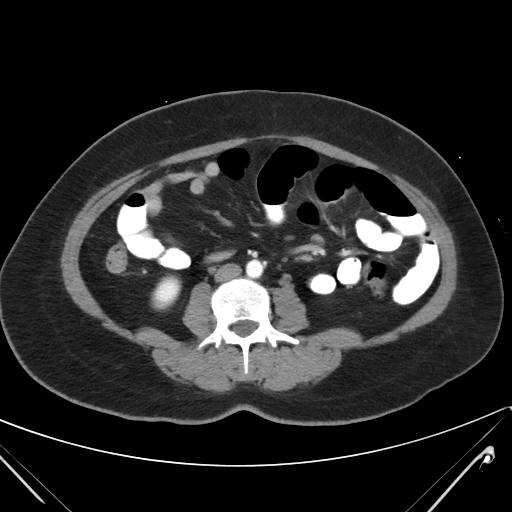
[im 57/93  soft-tissue]
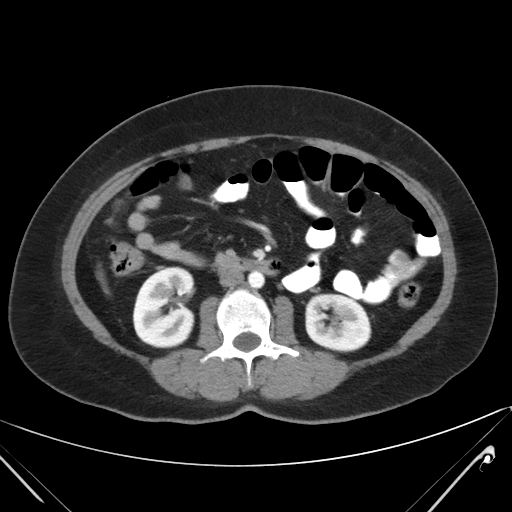
[im 57/93  bone]
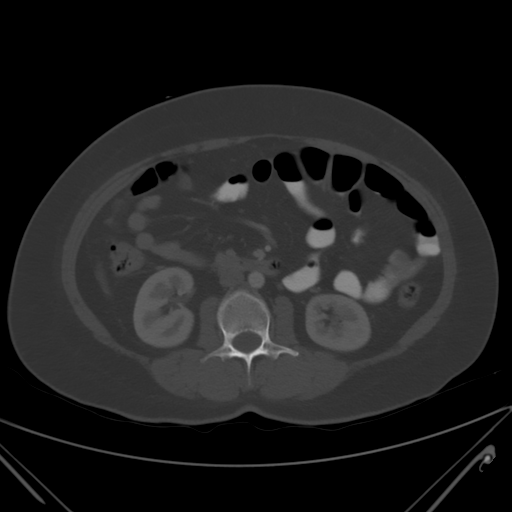
[im 61/93  soft-tissue]
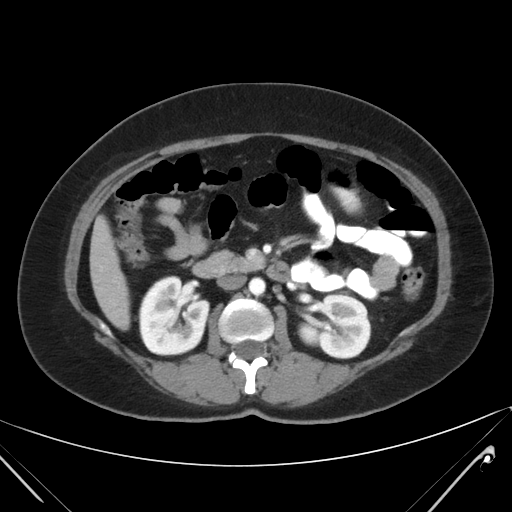
[im 68/93  soft-tissue]
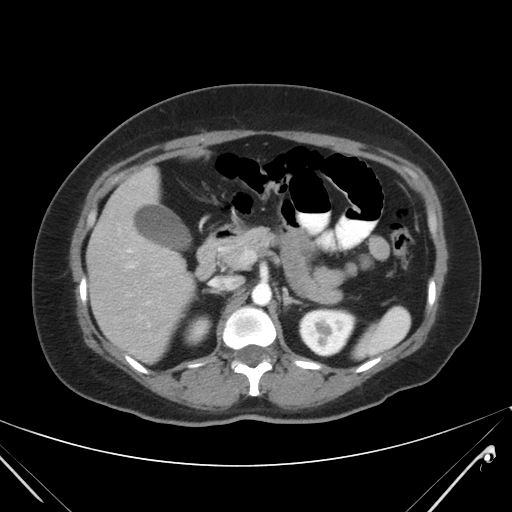
[im 75/93  soft-tissue]
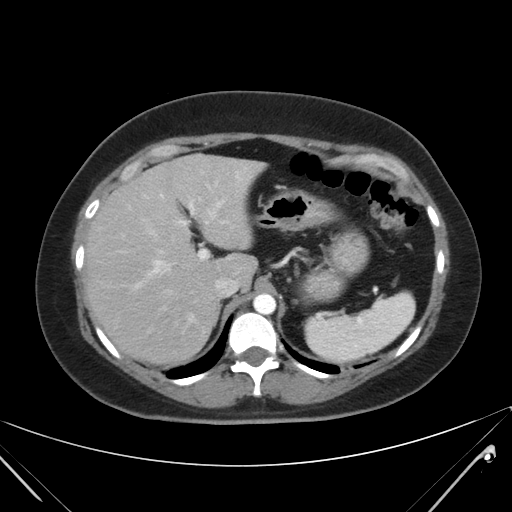
[im 82/93  soft-tissue]
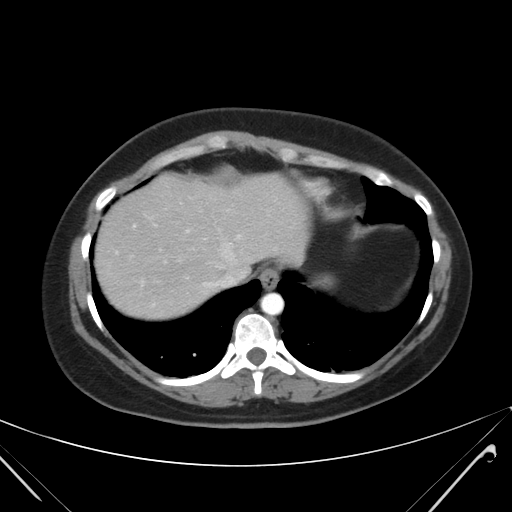
[im 89/93  soft-tissue]
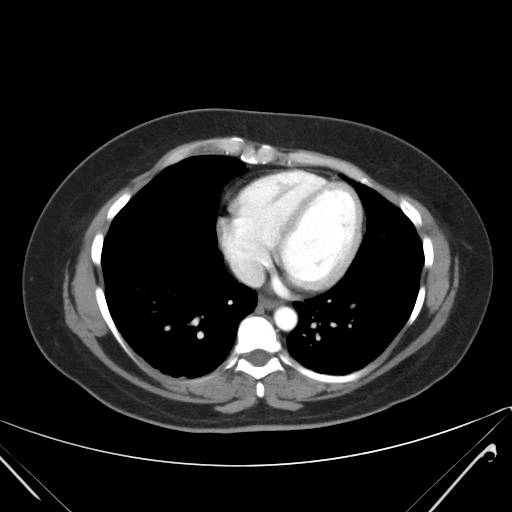

[mpr, coronals, coronal · coronal · 0.90mm/px · 3 of 90 slices shown]
[im 30/90  soft-tissue]
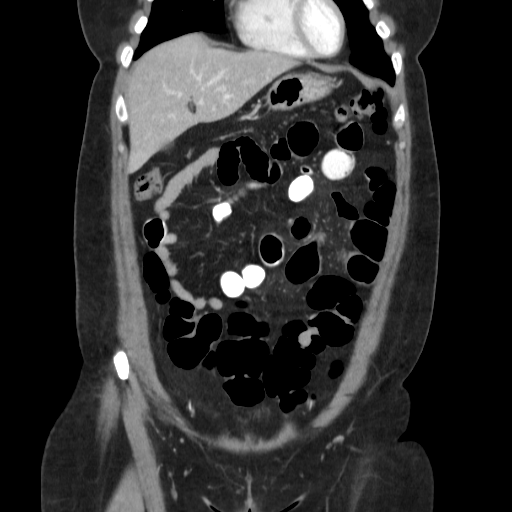
[im 40/90  soft-tissue]
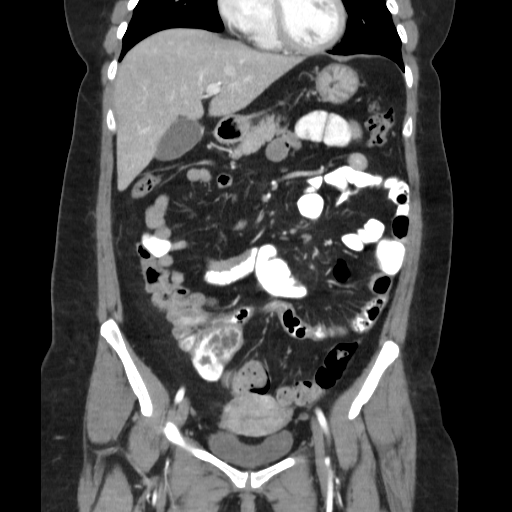
[im 50/90  soft-tissue]
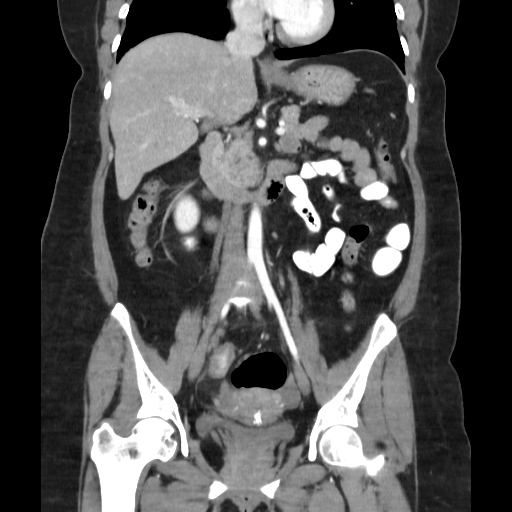

[17 of 46 positions shown; findings below may reference images not displayed]

FINDINGS: Minimal bibasilar atelectasis is noted.

The liver and spleen are unremarkable in appearance. The gallbladder
is within normal limits. The pancreas and adrenal glands are
unremarkable.

The kidneys are unremarkable in appearance. There is no evidence of
hydronephrosis. No renal or ureteral stones are seen. No perinephric
stranding is appreciated.

No free fluid is identified. The small bowel is unremarkable in
appearance. The stomach is within normal limits. No acute vascular
abnormalities are seen.

The appendix is diminutive but grossly normal in appearance, without
evidence for appendicitis. The colon is grossly unremarkable in
appearance. Contrast progresses to the level of the cecum.

The bladder is largely decompressed and grossly unremarkable. The
uterus is within normal limits, aside from mild intramural
calcification, likely postoperative. The ovaries are relatively
symmetric; no suspicious adnexal masses are seen. No inguinal
lymphadenopathy is seen.

No acute osseous abnormalities are identified.
IMPRESSION: No acute abnormality seen to explain the patient's symptoms.

## 2014-10-26 ENCOUNTER — Encounter (HOSPITAL_COMMUNITY): Payer: Self-pay | Admitting: Family Medicine

## 2014-10-26 ENCOUNTER — Emergency Department (HOSPITAL_COMMUNITY)
Admission: EM | Admit: 2014-10-26 | Discharge: 2014-10-26 | Disposition: A | Discharge status: Home / Self Care | Payer: No Typology Code available for payment source | Attending: Emergency Medicine | Admitting: Emergency Medicine

## 2014-10-26 DIAGNOSIS — F419 Anxiety disorder, unspecified: Secondary | ICD-10-CM | POA: Insufficient documentation

## 2014-10-26 DIAGNOSIS — R0789 Other chest pain: Secondary | ICD-10-CM | POA: Insufficient documentation

## 2014-10-26 DIAGNOSIS — T404X5A Adverse effect of other synthetic narcotics, initial encounter: Secondary | ICD-10-CM | POA: Insufficient documentation

## 2014-10-26 DIAGNOSIS — Z79899 Other long term (current) drug therapy: Secondary | ICD-10-CM | POA: Insufficient documentation

## 2014-10-26 DIAGNOSIS — T50905A Adverse effect of unspecified drugs, medicaments and biological substances, initial encounter: Secondary | ICD-10-CM

## 2014-10-26 DIAGNOSIS — Z8669 Personal history of other diseases of the nervous system and sense organs: Secondary | ICD-10-CM | POA: Insufficient documentation

## 2014-10-26 LAB — CBG MONITORING, ED: Glucose-Capillary: 87 mg/dL (ref 70–99)

## 2014-10-26 MED ORDER — LORAZEPAM 0.5 MG PO TABS
1.0000 mg | ORAL_TABLET | Freq: Once | ORAL | Status: AC
  Administered 2014-10-26: 1 mg via ORAL
  Filled 2014-10-26: qty 2

## 2014-10-26 NOTE — ED Notes (Signed)
Pt here for not feeling right after taking tramadol. sts that she took last night for headache. sts she woke up today and  feels anxious and like her body is shaking. This is per [employer].

## 2014-10-26 NOTE — ED Notes (Signed)
Pt sts that she feels anxious after taking Tramadol last night.  She has taken it in the past without a reaction.  She does have a prescription for this medication.

## 2014-10-26 NOTE — Discharge Instructions (Signed)
Don't taken the ultram if it makes you feel this way Brittney [name] a los frmacos (Drug Allergy) Las Chief of Staff a los frmacos son frecuentes. Algunas reacciones alrgicas son leves. Un tipo de [place] retardada a las drogas que ocurre luego de una semana o ms despus de la exposicin al medicamento o la vacuna se conoce como enfermedad del suero. Una reaccin alrgica sbita (aguda) que involucra a todo el organismo se denomina anafilaxis. Collins "verdaderas" alergias a las drogas aparecen cuando se tiene una reaccin alrgica a un medicamento. Est ocasionada por la sobreactividad del sistema inmunolgico. El cuerpo se sensibiliza. El sistema inmunolgico reacciona con la primera exposicin al [employer]. Luego de [place], su utilizacin a futuro Counselling psychologist vida. Casi cualquier medicamento puede causar Chief of Staff. Los ms comunes son:  Penicilina.  Sulfonamida.  Anestsicos locales.  Tinturas para rayos X que contienen yodo. SNTOMAS Los sntomas ms frecuentes de las alergias menores son:   Hinchazn alrededor de la boca.  Una erupcin roja que produce picazn o urticaria.  Vmitos o diarrea. La anafilaxis puede producir hinchazn de la boca y Patent examiner. Esto dificulta la respiracin y la deglucin. Las [employer] graves pueden ser fatales en segundos incluso luego de haber estado expuesto a una pequea cantidad de la droga que la haya producido.  INSTRUCCIONES PARA EL CUIDADO DOMICILIARIO  Si no est seguro de que es lo que le produce la reaccin, Quarry manager un registro de los alimentos que come y los medicamentos que ha ingerido. Incluya los sntomas que le siguen. Evite los Nurse, mental health.  Deber realizar un seguimiento con un especialista en alergia despus que la reaccin haya desaparecido, para que pueda ser diagnosticado y Presenter, broadcasting. Es importante confirmar que su reaccin es IT consultant y no slo un efecto secundario al  [employer]. Si tiene una alergia verdadera a Ecologist, tendr Mirant administren ese medicamento y otros similares cuando se enferme.  Si presenta urticaria o una erupcin cutnea:  Tome los medicamentos como se le indic.  Puede utilizar un antihistamnico de venta libre (difenhidramina), segn sea necesario.  Aplique compresas fras en la piel. Tome un bao de agua fresca. Evite los [place] calientes.  Si usted es muy alrgico:  Awilda Bill reaccin grave y su tratamiento, ser necesaria la observacin continua. A menudo es necesaria la hospitalizacin.  Utilice un brazalete o collar de alerta mdico, indicando que usted es Air cabin crew.  Usted y su familia deben aprender como usar el kit para la anafilaxis o a Architectural technologist una inyeccin de epinefrina para tratar termporariamente una reaccin alrgica de emergencia. Si usted ya ha sufrido una reaccin grave, siempre lleve el kit anafilctico o la inyeccin de epinefrina con usted. Esto podr salvarle la vida si tiene una reaccin grave.  No conduzca ni realice tareas despus del tratamiento hasta que haya terminado los medicamentos o hasta que tenga la autorizacin del profesional que lo asiste. SOLICITE ATENCIN MDICA SI:  Sospecha que puede sufrir Buyer, retail. Los sntomas generalmente ocurren dentro de los 30 minutos posteriores a la exposicin.  Los sntomas empeoran en vez de Teacher, English as a foreign language.  Desarrolla nuevos sntomas.  Vuelven los sntomas por los que ha consultado. SOLICITE ATENCIN MDICA DE INMEDIATO SI:  Tiene hinchazn en la boca, jadeos o dificultad respiratoria.  Tiene una sensacin de opresin en el pecho o en la garganta.  Presenta sarpullido, hinchazn o picazn en todo el cuerpo.  Presenta diarrea o vmitos.  Se marea o  pierde el conocimiento. Esto es Engineer, maintenance (IT). Use la inyeccin de epinefrina o el kit para anafilaxis del modo en que le han indicado. Pida ayuda mdica de emergencia. Aunque  haya mejorado luego de la inyeccin, deber examinarse en el departamento de emergencias del hospital. EST SEGURO QUE:   Comprende las instrucciones para el alta mdica.  Controlar su enfermedad.  Solicitar atencin mdica de inmediato segn las indicaciones. Document Released: 05/03/2007 Document Revised: 09/28/2011 [facility] Patient Information 2015 [place]. This information is not intended to replace advice given to you by your health care provider. Make sure you discuss any questions you have with your health care provider.

## 2014-10-26 NOTE — ED Provider Notes (Signed)
CSN: [id number]     Arrival date & time 10/26/14  1318 History  This chart was scribed for non-physician practitioner, Glendell Docker, NP working with Francine Graven, DO by Tula Nakayama, ED scribe. This patient was seen in room TR10C/TR10C and the patient's care was started at 3:07 PM   Chief Complaint  Patient presents with  . Allergic Reaction   The history is provided by the patient. No language interpreter was used.   HPI Comments: Brittney [name] is a 39 y.o. female who presents to the Emergency Department complaining of constant, moderate trembling that started this morning. She states mild chest pressure, hand numbness and anxiety as associated symptoms. Pt reports that she feels a trembling sensation and feels desperate. She denies a history of similar symptoms. Pt states she took Tramadol last night for a HA. She has taken the medication before with no adverse reaction. Her LNMP was 15 days ago. Pt has eaten breakfast today. She denies SOB as an associated symptom.   Past Medical History  Diagnosis Date  . Meningitis 2010, 2007   Past Surgical History  Procedure Laterality Date  . Cesarean section     History reviewed. No pertinent family history. History  Substance Use Topics  . Smoking status: Never Smoker   . Smokeless tobacco: Never Used  . Alcohol Use: No   OB History    Gravida Para Term Preterm AB TAB SAB Ectopic Multiple Living   5 4 3 1 1  0 1 0 0 0     Review of Systems  Respiratory: Negative for shortness of breath.   Cardiovascular: Positive for chest pain.  Neurological: Positive for numbness.  Psychiatric/Behavioral: The patient is nervous/anxious.   All other systems reviewed and are negative.   Allergies  Review of patient's allergies indicates no known allergies.  Home Medications   Prior to Admission medications   Medication Sig Start Date End Date Taking? Authorizing Provider  bacitracin 500 UNIT/GM ointment Apply 1 application topically 2  (two) times daily. 06/01/13   Dayarmys Piloto de Gwendalyn Ege, MD  FLUoxetine (PROZAC) 20 MG capsule Take 1 capsule (20 mg total) by mouth daily. 08/26/13   Rosebud, MD  methocarbamol (ROBAXIN) 500 MG tablet Take 1 tablet (500 mg total) by mouth 2 (two) times daily as needed for muscle spasms. 10/20/13   Noemi Chapel, MD  naproxen (NAPROSYN) 500 MG tablet Take 1 tablet (500 mg total) by mouth 2 (two) times daily with a meal. 10/20/13   Noemi Chapel, MD  propranolol (INDERAL) 10 MG tablet Take 1 tablet (10 mg total) by mouth 2 (two) times daily. 11/07/13   Dayarmys Piloto de Gwendalyn Ege, MD  traMADol (ULTRAM) 50 MG tablet Take 1 tablet (50 mg total) by mouth every 8 (eight) hours as needed. 11/07/13   Dayarmys Piloto de Gwendalyn Ege, MD   BP 127/82 mmHg  Pulse 78  Temp(Src) 98.3 F (36.8 C) (Oral)  Resp 16  SpO2 100%  LMP 10/05/2014 Physical Exam  Constitutional: She is oriented to person, place, and time. She appears well-developed and well-nourished. No distress.  HENT:  Head: Normocephalic and atraumatic.  Right Ear: External ear normal.  Left Ear: External ear normal.  Mouth/Throat: Oropharynx is clear and moist.  Eyes: Conjunctivae and EOM are normal.  Neck: Neck supple. No tracheal deviation present.  Cardiovascular: Normal rate.   Pulmonary/Chest: Effort normal and breath sounds normal. No respiratory distress.  Musculoskeletal: Normal range of motion.  Neurological: She  is alert and oriented to person, place, and time.  Skin: Skin is warm and dry.  Psychiatric: She has a normal mood and affect. Her behavior is normal.  Nursing note and vitals reviewed.   ED Course  Procedures   DIAGNOSTIC STUDIES: Oxygen Saturation is 100% on RA, normal by my interpretation.    COORDINATION OF CARE: 3:11 PM Discussed treatment plan with pt at bedside and pt agreed to plan.   Labs Review Labs Reviewed  CBG MONITORING, ED    Imaging Review No results found.   EKG Interpretation None       MDM   Final diagnoses:  Medication reaction, initial encounter    Pt is feeling better at this time. Likely side effect of medication discussed not continuing to take the medication  I personally performed the services described in this documentation, which was scribed in my presence. The recorded information has been reviewed and is accurate.    Glendell Docker, NP 10/26/14 [place], DO 10/27/14 [phone]

## 2014-10-26 NOTE — ED Notes (Signed)
Pt provided with Sprite due to pt concern over CBG.  RN explained normal for not feeling like eating, pt verbalized understanding.

## 2018-06-18 ENCOUNTER — Other Ambulatory Visit: Payer: Self-pay

## 2018-06-18 ENCOUNTER — Encounter (HOSPITAL_COMMUNITY): Payer: Self-pay

## 2018-06-18 ENCOUNTER — Emergency Department (HOSPITAL_COMMUNITY)
Admission: EM | Admit: 2018-06-18 | Discharge: 2018-06-18 | Disposition: A | Discharge status: Home / Self Care | Payer: No Typology Code available for payment source | Attending: Emergency Medicine | Admitting: Emergency Medicine

## 2018-06-18 DIAGNOSIS — L02211 Cutaneous abscess of abdominal wall: Secondary | ICD-10-CM | POA: Insufficient documentation

## 2018-06-18 DIAGNOSIS — L0291 Cutaneous abscess, unspecified: Secondary | ICD-10-CM

## 2018-06-18 MED ORDER — LIDOCAINE HCL (PF) 1 % IJ SOLN
5.0000 mL | Freq: Once | INTRAMUSCULAR | Status: AC
  Administered 2018-06-18: 5 mL
  Filled 2018-06-18: qty 5

## 2018-06-18 MED ORDER — DOXYCYCLINE HYCLATE 100 MG PO CAPS: 100.0000 mg | ORAL_CAPSULE | Freq: Two times a day (BID) | ORAL | 0 refills | Status: DC

## 2018-06-18 NOTE — ED Triage Notes (Signed)
Pt reports that she has a bump on her lower abdomen that has been there for about a month and has gotten more red over the past few days. Pt reports that it is slightly painful and itchy.

## 2018-06-18 NOTE — Discharge Instructions (Addendum)
The wound on your abdominal wall should heal better now that has been opened.  To treat the wound apply warm compresses 3 or 4 times a day for 30 minutes to help the wound drain and heal.  We are prescribing antibiotics in case the wound remains infected, and to treat other areas that may be involved.  You may have MRSA, so it would be a good idea to see a primary care doctor for a checkup regarding this issue and 2 to 3 weeks.

## 2018-06-18 NOTE — ED Provider Notes (Signed)
[place] EMERGENCY DEPARTMENT Provider Note   CSN: [id number] Arrival date & time: 06/18/18  7829     History   Chief Complaint Chief Complaint  Patient presents with  . Abscess    HPI Brittney [name] is a 42 y.o. female.  HPI   She presents for evaluation of a sore on her abdomen which has worsened over the last several months.  In the last 3 days it is changed color and become very painful.  It started "as a pimple."  She denies fever, chills, abdominal pain, change in bowel or urinary habits.  There are no other known modifying factors.  Past Medical History:  Diagnosis Date  . Meningitis 2010, 2007    Patient Active Problem List   Diagnosis Date Noted  . Chronic headaches 11/07/2013  . Cutaneous skin tags 06/01/2013  . Depression 04/28/2013  . 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 ELEVATED BLOOD PRESSURE WITHOUT DIAGNOSIS OF HYPERTENSION 11/21/2009    Past Surgical History:  Procedure Laterality Date  . CESAREAN SECTION       OB History    Gravida  5   Para  4   Term  3   Preterm  1   AB  1   Living  0     SAB  1   TAB  0   Ectopic  0   Multiple  0   Live Births               Home Medications    Prior to Admission medications   Medication Sig Start Date End Date Taking? Authorizing Provider  doxycycline (VIBRAMYCIN) 100 MG capsule Take 1 capsule (100 mg total) by mouth 2 (two) times daily. One po bid x 7 days 06/18/18   Daleen Bo, MD  FLUoxetine (PROZAC) 20 MG capsule Take 1 capsule (20 mg total) by mouth daily. Patient not taking: Reported on 06/18/2018 08/26/13   Piloto Meriden, MD  methocarbamol (ROBAXIN) 500 MG tablet Take 1 tablet (500 mg total) by mouth 2 (two) times daily as needed for muscle spasms. Patient not taking: Reported on 06/18/2018 10/20/13   Noemi Chapel, MD  naproxen (NAPROSYN) 500 MG tablet Take 1 tablet (500 mg total) by mouth 2 (two) times daily with a meal. Patient not taking:  Reported on 06/18/2018 10/20/13   Noemi Chapel, MD  propranolol (INDERAL) 10 MG tablet Take 1 tablet (10 mg total) by mouth 2 (two) times daily. Patient not taking: Reported on 06/18/2018 11/07/13   Piloto de Gwendalyn Ege, Glennon Mac, MD  traMADol (ULTRAM) 50 MG tablet Take 1 tablet (50 mg total) by mouth every 8 (eight) hours as needed. Patient not taking: Reported on 06/18/2018 11/07/13   Piloto de Tressia Miners, MD    Family History No family history on file.  Social History Social History   Tobacco Use  . Smoking status: Never Smoker  . Smokeless tobacco: Never Used  Substance Use Topics  . Alcohol use: No  . Drug use: No     Allergies   Patient has no known allergies.   Review of Systems Review of Systems  All other systems reviewed and are negative.    Physical Exam Updated Vital Signs BP 109/63   Pulse (!) 57   Temp 98.7 F (37.1 C) (Oral)   Resp 18   Ht 5\' 1"  (1.549 m)   Wt 71.7 kg   SpO2 100%   BMI 29.85 kg/m   Physical Exam  Constitutional: She is oriented to person, place, and time. She appears well-developed and well-nourished.  HENT:  Head: Normocephalic and atraumatic.  Right Ear: External ear normal.  Left Ear: External ear normal.  Eyes: Pupils are equal, round, and reactive to light. Conjunctivae and EOM are normal.  Neck: Normal range of motion and phonation normal. Neck supple.  Cardiovascular: Normal rate.  Pulmonary/Chest: Effort normal. She exhibits no bony tenderness.  Abdominal: Soft.  Small abscess right lower abdominal wall, tender, fluctuant, approximately 1 1/2 cm, pointing.  On the left anterior abdominal wall there is a minute reddened area, with a small raised center.  There is no associated fluctuance with this lesion.  Musculoskeletal: Normal range of motion.  Neurological: She is alert and oriented to person, place, and time. No cranial nerve deficit or sensory deficit. She exhibits normal muscle tone. Coordination normal.  Skin:  Skin is warm, dry and intact.  Psychiatric: She has a normal mood and affect. Her behavior is normal. Judgment and thought content normal.  Nursing note and vitals reviewed.    ED Treatments / Results  Labs (all labs ordered are listed, but only abnormal results are displayed) Labs Reviewed - No data to display  EKG None  Radiology No results found.  Procedures .Marland KitchenIncision and Drainage Date/Time: 06/18/2018 11:50 AM Performed by: Daleen Bo, MD Authorized by: Daleen Bo, MD   Consent:    Consent obtained:  Verbal   Risks discussed:  Bleeding and pain   Alternatives discussed:  No treatment Location:    Type:  Abscess   Size:  2 cm   Location: Abdominal wall. Pre-procedure details:    Skin preparation:  Betadine Anesthesia (see MAR for exact dosages):    Anesthesia method:  Local infiltration   Local anesthetic:  Lidocaine 1% w/o epi Procedure type:    Complexity:  Simple Procedure details:    Incision types:  Single straight   Incision depth:  Subcutaneous   Scalpel blade:  11   Wound management:  Probed and deloculated   Drainage amount:  Scant   Wound treatment:  Wound left open   Packing materials:  None Post-procedure details:    Patient tolerance of procedure:  Tolerated well, no immediate complications   (including critical care time)  Medications Ordered in ED Medications  lidocaine (PF) (XYLOCAINE) 1 % injection 5 mL (5 mLs Infiltration Given 06/18/18 1010)     Initial Impression / Assessment and Plan / ED Course  I have reviewed the triage vital signs and the nursing notes.  Pertinent labs & imaging results that were available during my care of the patient were reviewed by me and considered in my medical decision making (see chart for details).      Patient Vitals for the past 24 hrs:  BP Temp Temp src Pulse Resp SpO2 Height Weight  06/18/18 1131 109/63 - - (!) 57 18 100 % - -  06/18/18 1115 112/62 - - (!) 45 18 100 % - -  06/18/18  1100 118/74 - - (!) 50 - 98 % - -  06/18/18 0949 - - - - - - 5\' 1"  (1.549 m) 71.7 kg  06/18/18 0946 124/80 98.7 F (37.1 C) Oral 66 20 98 % - -    10:05 AM Reevaluation with update and discussion. After initial assessment and treatment, an updated evaluation reveals she is comfortable at this time.  Findings discussed and questions answered. Daleen Bo   Medical Decision Making: Skin abscess, likely MRSA, treated  with I&D.  She has an adjacent small early lesion, therefore oral antibiotic's are prescribed.  Doubt sepsis, or metabolic instability.  CRITICAL CARE-no Performed by: Daleen Bo  Nursing Notes Reviewed/ Care Coordinated Applicable Imaging Reviewed Interpretation of Laboratory Data incorporated into ED treatment  The patient appears reasonably screened and/or stabilized for discharge and I doubt any other medical condition or other [facility] requiring further screening, evaluation, or treatment in the ED at this time prior to discharge.  Plan: Home Medications-OTC analgesia and antipyretic if needed; Home Treatments-wound care with compresses; return here if the recommended treatment, does not improve the symptoms; Recommended follow up-PCP follow-up 1 to 2 weeks for consideration of MRSA treatment and eradication   Final Clinical Impressions(s) / ED Diagnoses   Final diagnoses:  Abscess    ED Discharge Orders         Ordered    doxycycline (VIBRAMYCIN) 100 MG capsule  2 times daily     06/18/18 1152           Daleen Bo, MD 06/18/18 1158

## 2018-06-18 NOTE — ED Notes (Signed)
Pt discharge instructions given. Pt verbalized understanding.

## 2019-10-28 ENCOUNTER — Encounter: Payer: Self-pay | Admitting: Emergency Medicine

## 2019-10-28 ENCOUNTER — Other Ambulatory Visit: Payer: Self-pay

## 2019-10-28 ENCOUNTER — Ambulatory Visit
Admission: EM | Admit: 2019-10-28 | Discharge: 2019-10-28 | Disposition: A | Discharge status: Home / Self Care | Payer: 59 | Attending: Emergency Medicine | Admitting: Emergency Medicine

## 2019-10-28 ENCOUNTER — Other Ambulatory Visit
Admission: RE | Admit: 2019-10-28 | Discharge: 2019-10-28 | Disposition: A | Discharge status: Home / Self Care | Payer: 59 | Source: Ambulatory Visit | Attending: Emergency Medicine | Admitting: Emergency Medicine

## 2019-10-28 DIAGNOSIS — M79601 Pain in right arm: Secondary | ICD-10-CM | POA: Insufficient documentation

## 2019-10-28 DIAGNOSIS — M79602 Pain in left arm: Secondary | ICD-10-CM

## 2019-10-28 DIAGNOSIS — R202 Paresthesia of skin: Secondary | ICD-10-CM | POA: Diagnosis not present

## 2019-10-28 LAB — BASIC METABOLIC PANEL
Anion gap: 11 (ref 5–15)
BUN: 14 mg/dL (ref 6–20)
CO2: 22 mmol/L (ref 22–32)
Calcium: 9.4 mg/dL (ref 8.9–10.3)
Chloride: 106 mmol/L (ref 98–111)
Creatinine, Ser: 0.62 mg/dL (ref 0.44–1.00)
GFR calc Af Amer: >60 mL/min (ref >60)
GFR calc non Af Amer: >60 mL/min (ref >60)
Glucose, Bld: 97 mg/dL (ref 70–99)
Potassium: 3.8 mmol/L (ref 3.5–5.1)
Sodium: 139 mmol/L (ref 135–145)

## 2019-10-28 LAB — TSH: TSH: 1.357 u[IU]/mL (ref 0.350–4.500)

## 2019-10-28 LAB — CBC
HCT: 41 % (ref 36.0–46.0)
Hemoglobin: 13.7 g/dL (ref 12.0–15.0)
MCH: 31.3 pg (ref 26.0–34.0)
MCHC: 33.4 g/dL (ref 30.0–36.0)
MCV: 93.6 fL (ref 80.0–100.0)
Platelets: 287 10*3/uL (ref 150–400)
RBC: 4.38 MIL/uL (ref 3.87–5.11)
RDW: 13.2 % (ref 11.5–15.5)
WBC: 6.4 10*3/uL (ref 4.0–10.5)
nRBC: 0 % (ref 0.0–0.2)

## 2019-10-28 MED ORDER — HYDROXYZINE HCL 25 MG PO TABS: 25.0000 mg | ORAL_TABLET | Freq: Four times a day (QID) | ORAL | 0 refills | Status: DC

## 2019-10-28 NOTE — ED Provider Notes (Signed)
[facility]    CSN: [id number] Arrival date & time: 10/28/19  1325      History   Chief Complaint Chief Complaint  Patient presents with  . Tingling    HPI Brittney [name] is a 44 y.o. female with history of meningitis (2007, 2010) presenting for bilateral numbness and tingling in both forearms, hands, feet.  Video [employer] interpreter services used.  Patient states is been ongoing/constant for 2 days.  States it wakes her from her sleep and is really irritating.  Patient denies recent head trauma, injury, change in vision or hearing.  No neck pain, back pain.  No rash, bug bites.  Patient did undergo second dose of Covid vaccine 3/31: Had sequela of headache, fever, chills, nausea x2 days.  Patient currently denying headache, fever, chills, arthralgias, myalgias.  Denies history of thyroid disorder, electrolyte imbalance.   Past Medical History:  Diagnosis Date  . Meningitis 2010, 2007    Patient Active Problem List   Diagnosis Date Noted  . Chronic headaches 11/07/2013  . Cutaneous skin tags 06/01/2013  . Depression 04/28/2013  . 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 ELEVATED BLOOD PRESSURE WITHOUT DIAGNOSIS OF HYPERTENSION 11/21/2009    Past Surgical History:  Procedure Laterality Date  . CESAREAN SECTION      OB History    Gravida  5   Para  4   Term  3   Preterm  1   AB  1   Living  0     SAB  1   TAB  0   Ectopic  0   Multiple  0   Live Births               Home Medications    Prior to Admission medications   Medication Sig Start Date End Date Taking? Authorizing Provider  FLUoxetine (PROZAC) 20 MG capsule Take 1 capsule (20 mg total) by mouth daily. Patient not taking: Reported on 06/18/2018 08/26/13   Piloto de Gwendalyn Ege, Glennon Mac, MD  hydrOXYzine (ATARAX/VISTARIL) 25 MG tablet Take 1 tablet (25 mg total) by mouth every 6 (six) hours. 10/28/19   Hall-Potvin, [place], PA-C  methocarbamol (ROBAXIN) 500 MG tablet Take 1 tablet (500 mg  total) by mouth 2 (two) times daily as needed for muscle spasms. Patient not taking: Reported on 06/18/2018 10/20/13   Noemi Chapel, MD  naproxen (NAPROSYN) 500 MG tablet Take 1 tablet (500 mg total) by mouth 2 (two) times daily with a meal. Patient not taking: Reported on 06/18/2018 10/20/13   Noemi Chapel, MD  propranolol (INDERAL) 10 MG tablet Take 1 tablet (10 mg total) by mouth 2 (two) times daily. Patient not taking: Reported on 06/18/2018 11/07/13   Piloto de Gwendalyn Ege, Glennon Mac, MD  traMADol (ULTRAM) 50 MG tablet Take 1 tablet (50 mg total) by mouth every 8 (eight) hours as needed. Patient not taking: Reported on 06/18/2018 11/07/13   Piloto de Tressia Miners, MD    Family History History reviewed. No pertinent family history.  Social History Social History   Tobacco Use  . Smoking status: Never Smoker  . Smokeless tobacco: Never Used  Substance Use Topics  . Alcohol use: No  . Drug use: No     Allergies   Patient has no known allergies.   Review of Systems As per HPI   Physical Exam Triage Vital Signs ED Triage Vitals [10/28/19 1444]  Enc Vitals Group     BP 137/85     Pulse Rate  64     Resp 16     Temp 98.3 F (36.8 C)     Temp Source Oral     SpO2 98 %     Weight      Height      Head Circumference      Peak Flow      Pain Score      Pain Loc      Pain Edu?      Excl. in [place]?    No data found.  Updated Vital Signs BP 137/85 (BP Location: Left Arm)   Pulse 64   Temp 98.3 F (36.8 C) (Oral)   Resp 16   LMP 10/07/2019   SpO2 98%   Visual Acuity Right Eye Distance:   Left Eye Distance:   Bilateral Distance:    Right Eye Near:   Left Eye Near:    Bilateral Near:     Physical Exam Constitutional:      General: She is not in acute distress.    Appearance: She is normal weight. She is not ill-appearing.  HENT:     Head: Normocephalic and atraumatic.     Right Ear: Tympanic membrane, ear canal and external ear normal.     Left Ear: Tympanic  membrane, ear canal and external ear normal.     Nose: Nose normal.     Mouth/Throat:     Mouth: Mucous membranes are moist.     Pharynx: Oropharynx is clear.  Eyes:     General: No scleral icterus.    Conjunctiva/sclera: Conjunctivae normal.     Pupils: Pupils are equal, round, and reactive to light.  Neck:     Comments: Negative [place] Cardiovascular:     Rate and Rhythm: Normal rate and regular rhythm.  Pulmonary:     Effort: Pulmonary effort is normal. No respiratory distress.     Breath sounds: No wheezing.  Musculoskeletal:        General: No tenderness. Normal range of motion.     Cervical back: Normal range of motion and neck supple. No rigidity or tenderness.     Right lower leg: No edema.     Left lower leg: No edema.  Lymphadenopathy:     Cervical: No cervical adenopathy.  Skin:    Capillary Refill: Capillary refill takes less than 2 seconds.     Coloration: Skin is not jaundiced or pale.  Neurological:     General: No focal deficit present.     Mental Status: She is alert and oriented to person, place, and time.     Cranial Nerves: No cranial nerve deficit.     Sensory: No sensory deficit.     Motor: No weakness.     Gait: Gait normal.     Deep Tendon Reflexes: Reflexes abnormal.     Comments: 3+ DTRs of brachioradialis, patella, Achilles bilaterally      UC Treatments / Results  Labs (all labs ordered are listed, but only abnormal results are displayed) Labs Reviewed  CBC  BASIC METABOLIC PANEL  TSH    EKG   Radiology No results found.  Procedures Procedures (including critical care time)  Medications Ordered in UC Medications - No data to display  Initial Impression / Assessment and Plan / UC Course  I have reviewed the triage vital signs and the nursing notes.  Pertinent labs & imaging results that were available during my care of the patient were reviewed by me and considered in my medical  decision making (see chart for details).       Patient afebrile, nontoxic in office today.  Unclear etiology.  Low concern for meningitis as patient is without nuchal rigidity, systemic symptoms such as fever.  Exam largely unremarkable, except for 3+ DTRs which are symmetric.  No clonus noted.  Patient denies sensory deficit on exam, though still has paresthesias.  Will obtain basic lab work as outlined above to screen for acute processes that could be contributory.  Patient will try hydroxyzine as she does endorse some anxiousness around this.  ER return precautions discussed, patient verbalized understanding and is agreeable to plan. Final Clinical Impressions(s) / UC Diagnoses   Final diagnoses:  Paresthesia and pain of both upper extremities  Paresthesia of both lower extremities     Discharge Instructions     Lo llamaremos maana con los resultados de su prueba. Pruebe la medicacin antes de acostarse para los sntomas. Llame al mdico de familia para programar una cita de seguimiento. Vaya a la sala de emergencias si presenta dolor en el pecho, dificultad para respirar, dolor de cabeza intenso o debilidad.    ED Prescriptions    Medication Sig Dispense Auth. Provider   hydrOXYzine (ATARAX/VISTARIL) 25 MG tablet Take 1 tablet (25 mg total) by mouth every 6 (six) hours. 12 tablet Hall-Potvin, [place], PA-C     PDMP not reviewed this encounter.   Hall-Potvin, [place], [place] 10/28/19 1647

## 2019-10-28 NOTE — Discharge Instructions (Addendum)
Lo llamaremos maana con los [place] de su prueba. Pruebe la medicacin antes de acostarse para los sntomas. Llame al mdico de familia para programar una cita de seguimiento. Vaya a la sala de emergencias si presenta dolor en el pecho, dificultad para respirar, dolor de cabeza intenso o debilidad.

## 2019-10-28 NOTE — ED Triage Notes (Addendum)
Bilateral numbness and tingling in both forearms and hands, needles and pins, numbness and tingling in feet.  This has been going on for 2 days.  Sensation wakes her up

## 2019-10-29 LAB — BASIC METABOLIC PANEL
BUN: 14 mg/dL (ref 6–20)
CO2: 22 mmol/L (ref 22–32)
Calcium: 9.4 mg/dL (ref 8.9–10.3)
Chloride: 106 mmol/L (ref 98–111)
Creatinine, Ser: 0.62 mg/dL (ref 0.44–1.00)
GFR calc Af Amer: >60 mL/min (ref >60)
GFR calc non Af Amer: >60 mL/min (ref >60)
Glucose: 97 mg/dL (ref 70–99)
Potassium: 3.8 mmol/L (ref 3.5–5.1)
Sodium: 139 mmol/L (ref 135–145)

## 2019-10-29 LAB — CBC
Hematocrit: 41 % (ref 36.0–46.0)
Hemoglobin: 13.7 g/dL (ref 12.0–15.0)
MCH: 31.3 pg (ref 26.0–34.0)
MCHC: 33.4 g/dL (ref 30.0–36.0)
MCV: 93.6 fL (ref 80.0–100.0)
Platelets: 287 10*3/uL (ref 150–400)
RBC: 4.38 MIL/uL (ref 3.87–5.11)
RDW: 13.2 % (ref 11.5–15.5)
WBC: 6.4 10*3/uL (ref 4.0–10.5)

## 2019-10-29 LAB — TSH: TSH: 1.357 u[IU]/mL (ref 0.350–4.500)

## 2020-01-03 ENCOUNTER — Ambulatory Visit
Admission: EM | Admit: 2020-01-03 | Discharge: 2020-01-03 | Disposition: A | Discharge status: Home / Self Care | Payer: 59 | Attending: Physician Assistant | Admitting: Physician Assistant

## 2020-01-03 ENCOUNTER — Ambulatory Visit: Admission: EM | Admit: 2020-01-03 | Discharge: 2020-01-03 | Discharge status: Absent without leave | Payer: 59

## 2020-01-03 DIAGNOSIS — N3090 Cystitis, unspecified without hematuria: Secondary | ICD-10-CM | POA: Diagnosis present

## 2020-01-03 LAB — POCT URINALYSIS DIP (MANUAL ENTRY)
Bilirubin, UA: NEGATIVE
Glucose, UA: NEGATIVE mg/dL
Ketones, POC UA: NEGATIVE mg/dL
Nitrite, UA: NEGATIVE
Protein Ur, POC: 30 mg/dL — AB
Spec Grav, UA: 1.025 (ref 1.010–1.025)
Urobilinogen, UA: 0.2 E.U./dL
pH, UA: 6.5 (ref 5.0–8.0)

## 2020-01-03 MED ORDER — CEPHALEXIN 500 MG PO CAPS: 500.0000 mg | ORAL_CAPSULE | Freq: Two times a day (BID) | ORAL | 0 refills | Status: DC

## 2020-01-03 NOTE — ED Triage Notes (Signed)
Pt c/o burning on urination with urgency and frequency x3 days. States AZO with relief.

## 2020-01-03 NOTE — ED Provider Notes (Signed)
[facility]    CSN: [id number] Arrival date & time: 01/03/20  1643      History   Chief Complaint Chief Complaint  Patient presents with  . Urinary Tract Infection    HPI Brittney [name] is a 44 y.o. female.   44 year old female comes in for 3 day history of urinary symptoms. HPI obtained by patient through video translator. Dysuria, urgency, frequency. Denies hematuria. Denies abdominal pain, nausea, vomiting. Denies fever, chills, flank/back pain. Denies vaginal discharge, itching, spotting. LMP 12/27/2019. AZO with some relief.      Past Medical History:  Diagnosis Date  . Meningitis 2010, 2007    Patient Active Problem List   Diagnosis Date Noted  . Chronic headaches 11/07/2013  . Cutaneous skin tags 06/01/2013  . Depression 04/28/2013  . 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 ELEVATED BLOOD PRESSURE WITHOUT DIAGNOSIS OF HYPERTENSION 11/21/2009    Past Surgical History:  Procedure Laterality Date  . CESAREAN SECTION      OB History    Gravida  5   Para  4   Term  3   Preterm  1   AB  1   Living  0     SAB  1   TAB  0   Ectopic  0   Multiple  0   Live Births               Home Medications    Prior to Admission medications   Medication Sig Start Date End Date Taking? Authorizing Provider  cephALEXin (KEFLEX) 500 MG capsule Take 1 capsule (500 mg total) by mouth 2 (two) times daily. 01/03/20   Ok Edwards, PA-C    Family History History reviewed. No pertinent family history.  Social History Social History   Tobacco Use  . Smoking status: Never Smoker  . Smokeless tobacco: Never Used  Substance Use Topics  . Alcohol use: No  . Drug use: No     Allergies   Patient has no known allergies.   Review of Systems Review of Systems  Reason unable to perform ROS: See HPI as above.     Physical Exam Triage Vital Signs ED Triage Vitals [01/03/20 1731]  Enc Vitals Group     BP 125/79     Pulse Rate 71     Resp 18      Temp 98.2 F (36.8 C)     Temp Source Oral     SpO2 99 %     Weight      Height      Head Circumference      Peak Flow      Pain Score 0     Pain Loc      Pain Edu?      Excl. in [place]?    No data found.  Updated Vital Signs BP 125/79 (BP Location: Left Arm)   Pulse 71   Temp 98.2 F (36.8 C) (Oral)   Resp 18   LMP 12/27/2019   SpO2 99%    Physical Exam Constitutional:      General: She is not in acute distress.    Appearance: She is well-developed. She is not ill-appearing, toxic-appearing or diaphoretic.  HENT:     Head: Normocephalic and atraumatic.  Eyes:     Conjunctiva/sclera: Conjunctivae normal.     Pupils: Pupils are equal, round, and reactive to light.  Cardiovascular:     Rate and Rhythm: Normal rate and regular rhythm.  Pulmonary:     Effort: Pulmonary effort is normal. No respiratory distress.     Comments: LCTAB Abdominal:     General: Bowel sounds are normal.     Palpations: Abdomen is soft.     Tenderness: There is no abdominal tenderness. There is no right CVA tenderness, left CVA tenderness, guarding or rebound.  Musculoskeletal:     Cervical back: Normal range of motion and neck supple.  Skin:    General: Skin is warm and dry.  Neurological:     Mental Status: She is alert and oriented to person, place, and time.  Psychiatric:        Behavior: Behavior normal.        Judgment: Judgment normal.      UC Treatments / Results  Labs (all labs ordered are listed, but only abnormal results are displayed) Labs Reviewed  POCT URINALYSIS DIP (MANUAL ENTRY) - Abnormal; Notable for the following components:      Result Value   Clarity, UA cloudy (*)    Blood, UA large (*)    Protein Ur, POC =30 (*)    Leukocytes, UA Large (3+) (*)    All other components within normal limits  URINE CULTURE    EKG   Radiology No results found.  Procedures Procedures (including critical care time)  Medications Ordered in UC Medications - No data to  display  Initial Impression / Assessment and Plan / UC Course  I have reviewed the triage vital signs and the nursing notes.  Pertinent labs & imaging results that were available during my care of the patient were reviewed by me and considered in my medical decision making (see chart for details).    Discussed AZO use can alter dipstick results, causing false positive at times. However, given significant symptoms, to start antibiotics as directed. Urine culture sent. Push fluids. Return precautions given.  Final Clinical Impressions(s) / UC Diagnoses   Final diagnoses:  Cystitis    ED Prescriptions    Medication Sig Dispense Auth. Provider   cephALEXin (KEFLEX) 500 MG capsule Take 1 capsule (500 mg total) by mouth 2 (two) times daily. 10 capsule Ok Edwards, PA-C     PDMP not reviewed this encounter.   Ok Edwards, PA-C 01/03/20 1800

## 2020-01-03 NOTE — Discharge Instructions (Signed)
Your urine was positive for an urinary tract infection. As discussed, AZO use may have altered results, urine culture sent. Start kelfex as directed. Keep hydrated, urine should be clear to pale yellow in color. Monitor for any worsening of symptoms, fever, worsening abdominal pain, nausea/vomiting, flank pain, follow up for reevaluation.

## 2020-01-06 LAB — URINE CULTURE: Culture: 50000 — AB

## 2020-05-22 ENCOUNTER — Other Ambulatory Visit: Payer: Self-pay

## 2020-05-22 ENCOUNTER — Encounter: Payer: Self-pay | Admitting: Emergency Medicine

## 2020-05-22 ENCOUNTER — Ambulatory Visit
Admission: EM | Admit: 2020-05-22 | Discharge: 2020-05-22 | Disposition: A | Discharge status: Home / Self Care | Payer: 59 | Attending: Family | Admitting: Family

## 2020-05-22 DIAGNOSIS — R110 Nausea: Secondary | ICD-10-CM | POA: Diagnosis not present

## 2020-05-22 DIAGNOSIS — H53149 Visual discomfort, unspecified: Secondary | ICD-10-CM | POA: Diagnosis not present

## 2020-05-22 DIAGNOSIS — R519 Headache, unspecified: Secondary | ICD-10-CM

## 2020-05-22 MED ORDER — ONDANSETRON 4 MG PO TBDP: 4.0000 mg | ORAL_TABLET | Freq: Three times a day (TID) | ORAL | 0 refills | Status: DC | PRN

## 2020-05-22 MED ORDER — KETOROLAC TROMETHAMINE 30 MG/ML IJ SOLN
30.0000 mg | Freq: Once | INTRAMUSCULAR | Status: AC
  Administered 2020-05-22: 30 mg via INTRAMUSCULAR

## 2020-05-22 MED ORDER — METOCLOPRAMIDE HCL 5 MG/ML IJ SOLN
5.0000 mg | Freq: Once | INTRAMUSCULAR | Status: AC
  Administered 2020-05-22: 5 mg via INTRAMUSCULAR

## 2020-05-22 MED ORDER — DICLOFENAC SODIUM 75 MG PO TBEC: 75.0000 mg | DELAYED_RELEASE_TABLET | Freq: Two times a day (BID) | ORAL | 0 refills | Status: DC | PRN

## 2020-05-22 NOTE — Discharge Instructions (Addendum)
You were given a shot today to help with your headache and nausea. Recommend take Diclofenac 75mg  twice a day as needed for pain. May take Zofran 4mg  every 8 hours as needed for nausea. Rest. Continue to push fluids to stay hydrated. Recommend call [facility] in [place] to schedule appointment for your headaches. If pain gets worse and unable to keep down fluids, go to the ER ASAP.

## 2020-05-22 NOTE — ED Provider Notes (Signed)
[facility]    CSN: [id number] Arrival date & time: 05/22/20  0951      History   Chief Complaint Chief Complaint  Patient presents with   Headache    HPI Brittney [name] is a 44 y.o. female.   44 year old female presents with headache that started Saturday (5 days ago). She noticed some vision changes on Saturday and then started having pain in the back of her eyes and the frontal area of her head. The pain has continued and encompasses her entire head now and travels down her neck and back. Her vision has improved but she is still sensitive to light and both eyes continue to tear due to headache pain. Also having nausea and decrease in appetite but no vomiting. Has felt fatigued but unable to sleep due to pain. Denies any sore throat, cough, chest pain or numbness. Has taken Tylenol and Ibuprofen with no relief. Has history of chronic headaches- appear to be migraine-like. Has had CT scan of her head in the past (last one listed in her records was in 2015 in the ER) which was negative. No other chronic health issues. Takes no daily medication.   The history is provided by the patient. The history is limited by a language barrier. A language interpreter was used (Used [employer] video Spanish interpreter).    Past Medical History:  Diagnosis Date   Meningitis 2010, 2007    Patient Active Problem List   Diagnosis Date Noted   Chronic headaches 11/07/2013   Cutaneous skin tags 06/01/2013   Depression 04/28/2013   Aseptic meningitis 11/21/2009   ELEVATED BLOOD PRESSURE WITHOUT DIAGNOSIS OF HYPERTENSION 11/21/2009    Past Surgical History:  Procedure Laterality Date   CESAREAN SECTION      OB History    Gravida  5   Para  4   Term  3   Preterm  1   AB  1   Living  0     SAB  1   TAB  0   Ectopic  0   Multiple  0   Live Births               Home Medications    Prior to Admission medications   Medication Sig Start Date End Date  Taking? Authorizing Provider  diclofenac (VOLTAREN) 75 MG EC tablet Take 1 tablet (75 mg total) by mouth 2 (two) times daily as needed for moderate pain. 05/22/20   Katy Apo, NP  ondansetron (ZOFRAN ODT) 4 MG disintegrating tablet Take 1 tablet (4 mg total) by mouth every 8 (eight) hours as needed for nausea. 05/22/20   Katy Apo, NP    Family History History reviewed. No pertinent family history.  Social History Social History   Tobacco Use   Smoking status: Never Smoker   Smokeless tobacco: Never Used  Substance Use Topics   Alcohol use: No   Drug use: No     Allergies   Patient has no known allergies.   Review of Systems Review of Systems  Constitutional: Positive for activity change, appetite change, chills and fatigue. Negative for fever.  HENT: Positive for ear pain (headache pain traveling to both ears), postnasal drip, rhinorrhea and sinus pain (frontal). Negative for congestion, dental problem, ear discharge, facial swelling, hearing loss, mouth sores, nosebleeds, sneezing, sore throat and trouble swallowing.   Eyes: Positive for photophobia and discharge (clear- tearing of both eyes). Negative for itching and visual disturbance.  Respiratory: Negative for cough, chest tightness, shortness of breath and wheezing.   Cardiovascular: Negative for chest pain.  Gastrointestinal: Positive for nausea. Negative for vomiting.  Musculoskeletal: Positive for back pain, myalgias and neck pain. Negative for neck stiffness.  Skin: Negative for color change, rash and wound.  Allergic/Immunologic: Negative for environmental allergies, food allergies and immunocompromised state.  Neurological: Positive for light-headedness and headaches. Negative for dizziness, tremors, seizures, syncope, facial asymmetry, weakness and numbness.  Hematological: Negative for adenopathy. Does not bruise/bleed easily.     Physical Exam Triage Vital Signs ED Triage Vitals  Enc Vitals  Group     BP 05/22/20 1004 138/80     Pulse Rate 05/22/20 1004 72     Resp 05/22/20 1004 16     Temp 05/22/20 1004 98.1 F (36.7 C)     Temp Source 05/22/20 1004 Oral     SpO2 05/22/20 1004 98 %     Weight --      Height --      Head Circumference --      Peak Flow --      Pain Score 05/22/20 1002 10     Pain Loc --      Pain Edu? --      Excl. in [place]? --    No data found.  Updated Vital Signs BP 138/80 (BP Location: Left Arm)    Pulse 72    Temp 98.1 F (36.7 C) (Oral)    Resp 16    LMP 05/22/2020    SpO2 98%   Visual Acuity Right Eye Distance:   Left Eye Distance:   Bilateral Distance:    Right Eye Near:   Left Eye Near:    Bilateral Near:     Physical Exam Vitals and nursing note reviewed.  Constitutional:      General: She is awake.     Appearance: She is well-developed and well-groomed. She is ill-appearing.     Comments: She is sitting in the exam chair and appears to be in pain and changing positions frequently and appears uncomfortable.   HENT:     Head: Normocephalic and atraumatic.     Jaw: There is normal jaw occlusion.     Right Ear: Hearing, tympanic membrane, ear canal and external ear normal.     Left Ear: Hearing, tympanic membrane, ear canal and external ear normal.     Nose: Rhinorrhea present. Rhinorrhea is clear.     Right Sinus: Maxillary sinus tenderness and frontal sinus tenderness present.     Left Sinus: Maxillary sinus tenderness and frontal sinus tenderness present.     Mouth/Throat:     Lips: Pink.     Mouth: Mucous membranes are moist.     Pharynx: Oropharynx is clear. Uvula midline. No pharyngeal swelling, oropharyngeal exudate, posterior oropharyngeal erythema or uvula swelling.  Eyes:     General: Lids are normal. Vision grossly intact.     Extraocular Movements: Extraocular movements intact.     Conjunctiva/sclera:     Right eye: Right conjunctiva is injected.     Left eye: Left conjunctiva is injected.     Pupils: Pupils are  equal, round, and reactive to light.     Comments: Conjunctiva mildly red and both eyes are tearing.   Neck:     Vascular: Normal carotid pulses. No carotid bruit or JVD.     Trachea: Trachea normal.  Cardiovascular:     Rate and Rhythm: Normal rate and regular rhythm.  Heart sounds: Normal heart sounds. No murmur heard.   Pulmonary:     Effort: Pulmonary effort is normal. No respiratory distress.     Breath sounds: Normal breath sounds and air entry. No decreased air movement. No decreased breath sounds, wheezing, rhonchi or rales.  Musculoskeletal:     Cervical back: Normal range of motion and neck supple. Tenderness present. No rigidity or crepitus. Muscular tenderness (posterior aspect of neck) present. No pain with movement. Normal range of motion.  Lymphadenopathy:     Cervical: No cervical adenopathy.     Right cervical: No superficial cervical adenopathy.    Left cervical: No superficial cervical adenopathy.  Skin:    General: Skin is warm and dry.     Findings: No rash.  Neurological:     General: No focal deficit present.     Mental Status: She is alert and oriented to person, place, and time.     Cranial Nerves: Cranial nerves are intact. No cranial nerve deficit or facial asymmetry.     Sensory: Sensation is intact. No sensory deficit.     Motor: Motor function is intact.     Deep Tendon Reflexes:     Reflex Scores:      Patellar reflexes are 3+ on the right side and 3+ on the left side.      Achilles reflexes are 3+ on the right side and 3+ on the left side.    Comments: Reflexes slightly hyperactive but appear to be her baseline from reviewing notes from previous exams.   Psychiatric:        Mood and Affect: Mood normal.        Behavior: Behavior normal. Behavior is cooperative.        Thought Content: Thought content normal.        Judgment: Judgment normal.      UC Treatments / Results  Labs (all labs ordered are listed, but only abnormal results are  displayed) Labs Reviewed - No data to display  EKG   Radiology No results found.  Procedures Procedures (including critical care time)  Medications Ordered in UC Medications  ketorolac (TORADOL) 30 MG/ML injection 30 mg (30 mg Intramuscular Given 05/22/20 1046)  metoCLOPramide (REGLAN) injection 5 mg (5 mg Intramuscular Given 05/22/20 1046)    Initial Impression / Assessment and Plan / UC Course  I have reviewed the triage vital signs and the nursing notes.  Pertinent labs & imaging results that were available during my care of the patient were reviewed by me and considered in my medical decision making (see chart for details).    Reviewed with patient that this headache may be a migraine. Doubt meningitis or other infectious etiology since no nuchal rigidity and no fever. Gave Toradol 30mg  IM now to help with pain. Since patient was nauseous, was originally planning to give Zofran IM but we only have 4mg  in 6ml which is significant amount of liquid to inject into muscle and patient may not tolerate oral Zofran 8mg  so decided to trial Reglan 5mg  IM. Patient rested for about 10 to 15 minutes after injections and indicated that her pain level had slightly improved. Patient has family member who will drive her home. May take Diclofenac 75mg  every 12 hours as needed for continued headache pain. May also take oral Zofran 4mg  every 8 hours as needed for nausea. Rest. Continue to push fluids to stay hydrated. Recommend contact [facility] in [place] to schedule appointment for further evaluation of chronic  headaches. If pain gets worse and unable to keep down fluids, go to the ER ASAP. Otherwise, [facility].  Final Clinical Impressions(s) / UC Diagnoses   Final diagnoses:  Frontal headache  Nausea without vomiting  Photophobia     Discharge Instructions     You were given a shot today to help with your headache and nausea. Recommend take  Diclofenac 75mg  twice a day as needed for pain. May take Zofran 4mg  every 8 hours as needed for nausea. Rest. Continue to push fluids to stay hydrated. Recommend call [facility] in [place] to schedule appointment for your headaches. If pain gets worse and unable to keep down fluids, go to the ER ASAP.     ED Prescriptions    Medication Sig Dispense Auth. Provider   ondansetron (ZOFRAN ODT) 4 MG disintegrating tablet Take 1 tablet (4 mg total) by mouth every 8 (eight) hours as needed for nausea. 15 tablet Katy Apo, NP   diclofenac (VOLTAREN) 75 MG EC tablet Take 1 tablet (75 mg total) by mouth 2 (two) times daily as needed for moderate pain. 20 tablet Tuwanna Krausz, Nicholes Stairs, NP     PDMP not reviewed this encounter.   Katy Apo, NP 05/23/20 1913

## 2020-05-22 NOTE — ED Triage Notes (Signed)
Patient c/o headache since Saturday. Patient also endorses nausea and back pain.   Patient is unable to sleep at night.   Patient states she's taken Ibuprofen at home w/ no relief of symptoms.     Patient has history of headaches.

## 2020-12-13 ENCOUNTER — Other Ambulatory Visit: Payer: Self-pay

## 2020-12-13 ENCOUNTER — Ambulatory Visit: Payer: 59 | Admitting: Family Medicine

## 2020-12-13 ENCOUNTER — Ambulatory Visit (INDEPENDENT_AMBULATORY_CARE_PROVIDER_SITE_OTHER): Payer: 59 | Admitting: Family Medicine

## 2020-12-13 ENCOUNTER — Encounter: Payer: Self-pay | Admitting: Family Medicine

## 2020-12-13 VITALS — BP 118/72 | HR 74 | Ht 62.0 in | Wt 166.2 lb

## 2020-12-13 DIAGNOSIS — M25559 Pain in unspecified hip: Secondary | ICD-10-CM

## 2020-12-13 DIAGNOSIS — Z1211 Encounter for screening for malignant neoplasm of colon: Secondary | ICD-10-CM

## 2020-12-13 DIAGNOSIS — Z0000 Encounter for general adult medical examination without abnormal findings: Secondary | ICD-10-CM

## 2020-12-13 DIAGNOSIS — N951 Menopausal and female climacteric states: Secondary | ICD-10-CM

## 2020-12-13 NOTE — Patient Instructions (Addendum)
It was great to meet you!  Things we discussed at today's visit: - You are likely starting menopause.  - Keep exercising regularly. - I have ordered your colonoscopy. Someone will call you to schedule this appointment. - You can take Ibuprofen 600mg  every 6-8 hours as needed for pain.  Take care and seek immediate care sooner if you develop any concerns.  Dr[facility]

## 2020-12-13 NOTE — Progress Notes (Signed)
   Subjective:   CC: Establish care  HPI:  Brittney [name] is a very pleasant 45 y.o. female who presents today to establish care. Prior PCP: Patient unable to recall the name of the practice. Last seen 1 year ago.  Initial concerns:  1. Hip pain: Patient reports bilateral hip pain. Located on lateral aspect of hip. Comes and goes over the past few weeks to months. Usually occurs at night when she's sleeping on her side. Resolves with Tylenol. She is concerned because her mom has osteoporosis. The pain is not worse with activity. She exercises regularly (walks, runs, goes to gym).   2. Possible menopause: patient is wondering if she is in menopause. She went 10 months without a period, but reports her period started 2 days ago. It is very light flow. No other symptoms such as hot flashes, mood changes, or vaginal dryness. Patient's Mom went through menopause around age 95.   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 No contraception  ROS: pertinent noted in the HPI   Objective:  BP 118/72   Pulse 74   Ht 5\' 2"  (1.575 m)   Wt 166 lb 3.2 oz (75.4 kg)   LMP 12/13/2020 (Exact Date)   SpO2 99%   BMI 30.40 kg/m   Vitals and nursing note reviewed  General: NAD, pleasant, able to participate in exam Cardiac: RRR, S1 S2 present. normal heart sounds, no murmurs. Respiratory: CTAB, normal effort, No wheezes, rales or rhonchi Abdomen: Bowel sounds present, non-tender, non-distended, no  hepatosplenomegaly Extremities: no edema or cyanosis. Hips: No tenderness to palpation of greater trochanteric bursa, full ROM bilaterally, normal internal and external rotation, negative straight leg raise, negative FADIR/FABER, 5/5 strength, normal gait Skin: warm and dry, no rashes noted Neuro: alert, no obvious focal deficits Psych: Normal affect and mood   Assessment & Plan:   Health Maintenance Patient is up to date on majority of preventative health measures. -Placed referral to GI for colonoscopy  -Patient declines COVID booster today -Per patient report, she had both pap and mammogram last year. Will try to locate these records and update in Epic accordingly.  -No indication for DEXA scan yet -BP reviewed and at goal -PHQ9 negative -Discussed strategies to achieve healthier BMI  Perimenopausal Discussed with patient that her periods may occur irregularly until they eventually stop completely. All questions answered.  Hip Pain Pain mostly at night when lying on side. Physical exam unremarkable. Possible greater trochanteric bursitis but unlikely given that it comes and goes and is mild in severity. Patient mainly worried about osteoporosis. -Ibuprofen prn for pain -Reassurance provided -Return if worsening   Alcus Dad, MD [place] PGY-1

## 2021-04-30 ENCOUNTER — Encounter: Payer: Self-pay | Admitting: Family Medicine

## 2021-05-29 ENCOUNTER — Encounter: Payer: Self-pay | Admitting: Family Medicine

## 2021-11-04 ENCOUNTER — Other Ambulatory Visit: Payer: Self-pay

## 2021-11-04 ENCOUNTER — Ambulatory Visit (INDEPENDENT_AMBULATORY_CARE_PROVIDER_SITE_OTHER): Payer: Self-pay | Admitting: Family Medicine

## 2021-11-04 VITALS — BP 129/92 | HR 68 | Wt 163.2 lb

## 2021-11-04 DIAGNOSIS — R519 Headache, unspecified: Secondary | ICD-10-CM | POA: Insufficient documentation

## 2021-11-04 NOTE — Progress Notes (Signed)
? ? ?  SUBJECTIVE:  ? ?CHIEF COMPLAINT / HPI:  ? ?Headaches ?x3 weeks, comes and goes ?Comes every day, typically lasts ~2 hrs ?No particular time of day- sometimes in the morning, sometimes afternoon, sometimes night ?Location varies, sometimes posterior, sometimes frontal ?Taking Ibuprofen '800mg'$  2-3 times per day with minimal improvement ?Wakes up from sleep due to headache  ?States this feels different from her headaches in the past- in the past she had migrainous type headaches that would be severe but not recur once they resolved. These are different because they come and go for weeks. ?No numbness or weakness, no nausea/vomiting, no photophobia or vision changes, no fever/chills ?Also feeling tired, irritable because of her headaches ?Endorses muscle tightness throughout her shoulders and neck ? ? ?PERTINENT  PMH / PSH: chronic headaches, h/o aseptic meningitis ? ?OBJECTIVE:  ? ?BP (!) 129/92   Pulse 68   Wt 163 lb 3.2 oz (74 kg)   SpO2 97%   BMI 29.85 kg/m?   ?Gen: alert, well-appearing, NAD ?HEENT: [place]/AT, PERRL, EOMI ?Neck: no tenderness to palpation, full ROM, no nuchal rigidity ?Resp: normal effort ?Neuro: CN II-XII intact, 5/5 strength in all extremities, sensation equal and intact to light touch in all extremities, normal gait, normal speech ?MSK: tightness appreciated in bilateral trapezius muscles ? ?ASSESSMENT/PLAN:  ? ?Headache, unspecified headache type ?Suspect tension type headache based on history and normal neuro exam. ?However, given the presence of 2 red flags-- wake her from sleep, feel different from her prior headaches, will obtain MRI to r/o more serious etiology. Of note, patient w/history of aseptic meningitis, but no concern for meningitis at this time (no nuchal rigidity, no fever, 3 week duration) ?-MRI brain w/o contrast ?-Counseled on headache prevention strategies in the meantime ?  ?Spanish interpreter Netty Starring [phone] used for duration of encounter ? ? ?Alcus Dad, MD ?[place]  ?

## 2021-11-04 NOTE — Patient Instructions (Signed)
It was great to see you! ? ?I think your headache is most likely a "tension headache", which can happen for several reasons including muscle tightness in your neck, dehydration, skipping meals, inadequate sleep, and more. ? ?However, I would like to get some imaging of your brain to make sure there is nothing else causing your headaches. We will call you to schedule an appointment for the MRI. ? ?In the meantime, try to get adequate sleep, drink plenty of water, and try a heating pad for 15 minutes 3x/day to relieve muscle tightness in your shoulders/neck. ? ?Take care, ?Dr. Rock Nephew ? ? ?Cefalea tensional en los adultos ?Tension Headache, Adult ?La cefalea tensional es una sensaci?n de dolor o presi?n en la frente y los lados de la cabeza. El dolor puede ser sordo o puede sentirse como una tensi?n. Hay dos tipos de cefaleas tensionales: ?Cefalea tensional epis?dica. Es cuando las cefaleas se producen menos de 15 d?as por mes. ?Cefalea tensional cr?nica. Es cuando las cefaleas se producen m?s de 15 d?as por mes en un per?odo de 3 meses. ?Las cefaleas tensionales pueden durar de 30 minutos a varios d?as. Constituyen el tipo m?s frecuente de dolor de [place]. Generalmente, no se asocian con n?useas o v?mitos y no empeoran con la actividad f?sica. ??Cu?les son las causas? ?Se desconoce la causa exacta de esta afecci?n. Las cefaleas tensionales generalmente aparecen despu?s de una situaci?n de estr?s, ansiedad o por depresi?n. Otros factores desencadenantes pueden incluir los siguientes: ?Alcohol. ?Exceso de cafe?na o abstinencia de cafe?na. ?Infecciones respiratorias, como resfriados, gripes o sinusitis. ?Problemas dentales o apretar los dientes. ?Fatiga. ?Mantener la cabeza y el cuello en la misma posici?n durante un per?odo prolongado, por ejemplo, al [employer]. ?Fumar. ?Artritis del cuello. ??Cu?les son los signos o los s?ntomas? ?Los s?ntomas de esta afecci?n incluyen: ?Sensaci?n de presi?n o tensi?n  alrededor de la cabeza. ?Dolor sordo en la cabeza. ?Dolor sobre la frente y los lados de la cabeza. ?Dolor a la palpaci?n en los m?sculos de la cabeza, del cuello y de los hombros. ??C?mo se diagnostica? ?Esta afecci?n se puede diagnosticar en funci?n de los s?ntomas, la historia cl?nica y los antecedentes m?dicos. ?Si los s?ntomas son agudos o inusuales, es posible que le hagan estudios de diagn?stico por im?genes, como una exploraci?n por tomograf?a computarizada (TC) o una resonancia magn?tica (RM) de la cabeza. Tambi?n le pueden revisar la vista. ??C?mo se trata? ?Esta afecci?n puede tratarse con cambios en el estilo de vida y medicamentos que ayudan a Public house manager los s?ntomas. ?Siga estas instrucciones en su casa: ?Control del dolor ?Use los medicamentos de venta libre y los recetados solamente como se lo haya indicado el m?dico. ?Cuando tenga una cefalea, acu?stese en una habitaci?n oscura y silenciosa. ?Si se lo indican, apl?quese hielo en la cabeza y en el cuello. Para hacer esto: ?Ponga el hielo en una bolsa pl?stica. ?Coloque una [employer] piel y Therapist, nutritional. ?Aplique el hielo durante 20 minutos, 2 o 3 veces por d?a. ?Retire el hielo si la piel se pone de color rojo brillante. Esto es [employer]. Si no puede sentir dolor, calor o fr?o, tiene un mayor riesgo de que se da?e la zona. ?Si se lo indican, aplique calor en la zona posterior del cuello con la frecuencia que le haya indicado el m?dico. Use la fuente de calor que el m?dico le recomiende, como una compresa de calor h?medo o una almohadilla t?rmica. ?Coloque una [employer] piel y la fuente de  calor. ?Aplique calor durante 20 a 30 minutos. ?Retire la fuente de calor si la piel se pone de color rojo brillante. Esto es especialmente importante si no puede sentir dolor, calor o fr?o. Corre un mayor riesgo de sufrir quemaduras. ?Comida y bebida ?Mantenga un horario para las comidas. ?Si bebe alcohol: ?Limite la cantidad que bebe a lo siguiente: ?De  0 a 1 medida por d?a para las mujeres que no est?n embarazadas. ?De 0 a 2 medidas por d?a para los hombres. ?Sepa cu?nta cantidad de alcohol hay en las bebidas que toma. En los [place], una medida equivale a una botella de cerveza de 12 oz (355 ml), un vaso de vino de 5 oz (148 ml) o un vaso de una bebida alcoh?lica de alta graduaci?n de 1? oz (44 ml). ?Beba suficiente l?quido como para mantener la orina de color amarillo p?lido. ?Disminuya el consumo de cafe?na o deje de consumir cafe?na. ?[place] ?[place] entre 7 y 84 horas o la cantidad de horas que le haya recomendado el m?dico. ?A la hora de [place], retire las computadoras, los tel?fonos y las tabletas del dormitorio. ?Busque maneras de [employer] estr?s. Esto puede incluir: ?Psychologist, occupational f?sica. ?Practicar ejercicios de respiraci?n profunda. ?Yoga. ?Escuchar m?sica. ?Visualizaci?n mental positiva. ?Trate de sentarse derecho y evite tensionar los m?sculos. ?No consuma ning?n producto que contenga nicotina o tabaco. Estos incluyen cigarrillos, tabaco para mascar y aparatos de vapeo, como los cigarrillos electr?nicos. Si necesita ayuda para dejar de fumar, consulte al m?dico. ?Instrucciones generales ? ?Evite cualquier desencadenante del dolor de cabeza. Lleve un registro diario para Neurosurgeon qu? [place] cefaleas. Registre, por ejemplo, lo siguiente: ?Lo que usted come y bebe. ?El tiempo que duerme. ?Alg?n cambio en su dieta o en los medicamentos. ?[place]. Esto es importante. ?Comun?quese con un m?dico si: ?La cefalea no se alivia. ?La cefalea regresa. ?Tiene sensibilidad a los sonidos, la luz o los olores debido a Scientific laboratory technician. ?Tiene n?useas o v?mitos. ?Le duele el est?mago. ?Solicite ayuda de inmediato si: ?Tiene una cefalea muy intensa y repentina junto con alguno de los siguientes s?ntomas: ?Rigidez en el cuello. ?N?useas y v?mitos. ?Confusi?n. ?Debilidad en una parte o un lado del  cuerpo. ?Visi?n doble o p?rdida de la visi?n. ?Falta de aire. ?Erupci?n cut?nea. ?Somnolencia inusual. ?Cristy Hilts o escalofr?os. ?Dificultad para hablar. ?Dolor en el ojo o el o?do. ?Dificultad para caminar o mantener el equilibrio. ?Mareo o desmayo. ?Resumen ?La cefalea tensional es una sensaci?n de dolor o presi?n en la frente y los lados de la cabeza. ?Las cefaleas tensionales pueden durar de 30 minutos a varios d?as. Constituyen el tipo m?s frecuente de dolor de [place]. ?Esta afecci?n se puede diagnosticar en funci?n de los s?ntomas, la historia cl?nica y los antecedentes m?dicos. ?Esta afecci?n puede tratarse con cambios en el estilo de vida y medicamentos que ayudan a Public house manager los s?ntomas. ?Esta informaci?n no tiene Marine scientist el consejo del m?dico. Aseg?rese de hacerle al m?dico cualquier pregunta que tenga. ?Document Revised: 05/14/2020 Document Reviewed: 05/14/2020 ?Elsevier Patient Education ? [place]. ? ?

## 2021-11-04 NOTE — Assessment & Plan Note (Addendum)
Suspect tension type headache based on history and normal neuro exam. ?However, given the presence of 2 red flags-- wake her from sleep, feel different from her prior headaches, will obtain MRI to r/o more serious etiology. Of note, patient w/history of aseptic meningitis, but no concern for meningitis at this time (no nuchal rigidity, no fever, 3 week duration) ?-MRI brain w/o contrast ?-Counseled on headache prevention strategies in the meantime ?

## 2021-11-12 ENCOUNTER — Telehealth: Payer: Self-pay | Admitting: *Deleted

## 2021-11-12 NOTE — Telephone Encounter (Signed)
LVM for pt to call office back to give her the appointment time for the MRI that the doctor ordered.  [employer] int [id number]#[phone] was used to leave the message.  If pt calls back please let her know that her appointment is scheduled on 11/22/2021 Saturday at 1:00pm, she should arrive at 12:30pm.  This will be done at [place]. She should take her insurance information to this appointment as the only insurance we have on file is for dental so if she doesn't have insurance she will be a self pay.Brittney [name], CMA ? ?

## 2021-11-17 NOTE — Telephone Encounter (Signed)
Attempted to contact pt to inform her of the information below using [place] int. [phone].  None of the numbers that are in the chart worked for the interpreter.  She tried several times.  If pt calls back please inform her of the appointment time and information.Jakylah Bassinger Zimmerman Rumple, CMA ? ?

## 2021-11-22 ENCOUNTER — Ambulatory Visit (HOSPITAL_COMMUNITY): Payer: Managed Care, Other (non HMO)

## 2022-03-02 ENCOUNTER — Encounter: Payer: Commercial Managed Care - HMO | Admitting: Family Medicine

## 2022-04-09 ENCOUNTER — Other Ambulatory Visit (HOSPITAL_COMMUNITY)
Admission: RE | Admit: 2022-04-09 | Discharge: 2022-04-09 | Disposition: A | Discharge status: Home / Self Care | Payer: Commercial Managed Care - HMO | Source: Ambulatory Visit | Attending: Family Medicine | Admitting: Family Medicine

## 2022-04-09 ENCOUNTER — Other Ambulatory Visit: Payer: Self-pay

## 2022-04-09 ENCOUNTER — Ambulatory Visit (INDEPENDENT_AMBULATORY_CARE_PROVIDER_SITE_OTHER): Payer: Commercial Managed Care - HMO | Admitting: Family Medicine

## 2022-04-09 VITALS — BP 133/80 | HR 61 | Wt 167.8 lb

## 2022-04-09 DIAGNOSIS — Z0000 Encounter for general adult medical examination without abnormal findings: Secondary | ICD-10-CM

## 2022-04-09 DIAGNOSIS — Z124 Encounter for screening for malignant neoplasm of cervix: Secondary | ICD-10-CM | POA: Insufficient documentation

## 2022-04-09 DIAGNOSIS — Z23 Encounter for immunization: Secondary | ICD-10-CM

## 2022-04-09 DIAGNOSIS — Z1159 Encounter for screening for other viral diseases: Secondary | ICD-10-CM

## 2022-04-09 DIAGNOSIS — Z13220 Encounter for screening for lipoid disorders: Secondary | ICD-10-CM

## 2022-04-09 DIAGNOSIS — Z1211 Encounter for screening for malignant neoplasm of colon: Secondary | ICD-10-CM | POA: Diagnosis not present

## 2022-04-09 NOTE — Progress Notes (Signed)
    SUBJECTIVE:   Chief compliant/HPI: annual examination  Brittney [name] is a 46 y.o. who presents today for an annual exam.   History tabs reviewed and updated.   OBJECTIVE:   BP 133/80   Pulse 61   Wt 167 lb 12.8 oz (76.1 kg)   SpO2 98%   BMI 30.69 kg/m   Gen: NAD, pleasant, able to participate in exam HEENT: [place]/AT, PERRLA, nares patent bilaterally, oropharynx unremarkable Neck: supple, no cervical or supraclavicular lymphadenopathy, thyroid smooth and normal in size CV: RRR, normal S1/S2, no murmur Resp: Normal effort, lungs CTAB GI: abdomen soft, non-tender, non-distended Extremities: no edema or cyanosis Skin: warm and dry, no rashes noted Neuro: alert, no obvious focal deficits Psych: Normal affect and mood GU/GYN: Exam performed in the presence of a chaperone. External genitalia within normal limits.  Vaginal mucosa pink, moist, normal rugae.  Nonfriable cervix without lesions, no discharge or bleeding noted on speculum exam.  Bimanual exam revealed normal, nongravid uterus.  No cervical motion tenderness. No adnexal masses bilaterally.    ASSESSMENT/PLAN:   Annual Examination  See AVS for age appropriate recommendations.   PHQ score 1, reviewed and discussed.  Blood pressure reviewed and at goal.  Asked about intimate partner violence and resources given as appropriate  The patient does not use contraception-- she is post-menopausal.  Considered the following items based upon USPSTF recommendations: Diabetes screening:  not ordered Screening for elevated cholesterol: ordered HIV testing:  previously completed, normal. Not at high risk Hepatitis C: ordered Hepatitis B:  not at high risk Syphilis if at high risk:  not at high risk GC/CT  ordered with pap but patient not at high risk Reviewed risk factors for latent tuberculosis and not indicated Reviewed risk factors for osteoporosis. Using FRAX tool estimated risk of major osteoporotic fracture of  2.3%, early  screening not ordered   Discussed family history, BRCA testing not indicated.  Cervical cancer screening: due for Pap today, cytology + HPV ordered Breast cancer screening:  due in October 2023, already has appointment scheduled Colorectal cancer screening: discussed, colonoscopy ordered if age 46 or over.   Immunizations: influenza and tdap administered today.  Follow up in 1 year or sooner if indicated.    Alcus Dad, MD [place]

## 2022-04-09 NOTE — Patient Instructions (Addendum)
It was great to see you!  Today we did your pap smear (screening for cervical cancer) along with STD testing. We are also checking your cholesterol and some other blood work. I will send you a MyChart message with the results.  You are due for a colonoscopy (screening test for colon cancer). I have placed a referral to the gastroenterologist's office. They should call you in a few weeks to months to schedule an appointment.  Schedule a separate appointment if you'd like to discuss sleep.  Things to do to keep yourself healthy  - Exercise at least 30-45 minutes a day, 3-4 days a week.  - Eat a diet with lots of fruits and vegetables (5 servings per day). - Avoid sugar sweetened beverages and processed foods whenever possible. - Seatbelts can save your life. Wear them always.  - Safe sex - use a condom to prevent STIs. - Alcohol -  If you drink, do it moderately, less than 2 drinks per day. - Sleep - aim for 7-8 hours nightly. - Limit screen time to no more than 2 hours per day.  - [place]. Choose someone to make decisions for you if you are not able.  - Depression is common in our stressful world. If you're feeling down or losing interest in things you normally enjoy, please come in for a visit.  - Violence - If anyone is threatening or hurting you, please call immediately.    Take care, Dr Rock Nephew

## 2022-04-10 LAB — LIPID PANEL
Chol/HDL Ratio: 3.6 ratio (ref 0.0–4.4)
Cholesterol, Total: 253 mg/dL — ABNORMAL HIGH (ref 100–199)
HDL: 71 mg/dL (ref >39)
LDL Chol Calc (NIH): 162 mg/dL — ABNORMAL HIGH (ref 0–99)
Triglycerides: 115 mg/dL (ref 0–149)
VLDL Cholesterol Cal: 20 mg/dL (ref 5–40)

## 2022-04-10 LAB — CYTOLOGY - PAP
Adequacy: ABSENT
Chlamydia: NEGATIVE
Comment: NEGATIVE
Comment: NEGATIVE
Comment: NEGATIVE
Comment: NORMAL
Diagnosis: NEGATIVE
High risk HPV: NEGATIVE
Neisseria Gonorrhea: NEGATIVE
Trichomonas: NEGATIVE

## 2022-04-10 LAB — HEPATITIS C ANTIBODY: Hep C Virus Ab: NONREACTIVE

## 2022-05-01 ENCOUNTER — Encounter: Payer: Self-pay | Admitting: Family Medicine

## 2022-06-16 ENCOUNTER — Encounter (HOSPITAL_COMMUNITY): Payer: Self-pay | Admitting: Emergency Medicine

## 2022-06-16 ENCOUNTER — Emergency Department (HOSPITAL_COMMUNITY)
Admission: EM | Admit: 2022-06-16 | Discharge: 2022-06-16 | Disposition: A | Discharge status: Home / Self Care | Payer: Commercial Managed Care - HMO | Attending: Emergency Medicine | Admitting: Emergency Medicine

## 2022-06-16 ENCOUNTER — Emergency Department (HOSPITAL_COMMUNITY): Payer: Commercial Managed Care - HMO

## 2022-06-16 ENCOUNTER — Other Ambulatory Visit: Payer: Self-pay

## 2022-06-16 DIAGNOSIS — M545 Low back pain, unspecified: Secondary | ICD-10-CM | POA: Insufficient documentation

## 2022-06-16 LAB — PREGNANCY, URINE: Preg Test, Ur: NEGATIVE

## 2022-06-16 MED ORDER — PREDNISONE 10 MG (21) PO TBPK: ORAL_TABLET | Freq: Every day | ORAL | 0 refills | Status: AC

## 2022-06-16 MED ORDER — METHOCARBAMOL 500 MG PO TABS: 500.0000 mg | ORAL_TABLET | Freq: Two times a day (BID) | ORAL | 0 refills | Status: AC

## 2022-06-16 MED ORDER — MELOXICAM 15 MG PO TABS: 15.0000 mg | ORAL_TABLET | Freq: Every day | ORAL | 0 refills | Status: AC

## 2022-06-16 NOTE — ED Provider Triage Note (Signed)
Emergency Medicine Provider Triage Evaluation Note  Brittney [name] , a 46 y.o. female  was evaluated in triage.  Pt complains of lower back pain for a week. No trauma or injury. Pain worse to paraspinous muscles on both sides. No  urinary symptoms, abdominal pain, nausea, vomiting, or prior problems with her back. .  Review of Systems  Positive: See HPI Negative: See HPI  Physical Exam  BP (!) 137/92 (BP Location: Right Arm)   Pulse 72   Temp 97.8 F (36.6 C)   Resp 12   SpO2 97%  Gen:   Awake, no distress  walking around room  Resp:  Normal effort  MSK:   Moving upper extremities normally, walking around room though refuses strength testing when attempted.  Other:  Alert and oriented, no sensory deficits, normal speech, nonfocal exam  Medical Decision Making  Medically screening exam initiated at 1:40 PM.  Appropriate orders placed.  Brittney [name] was informed that the remainder of the evaluation will be completed by another provider, this initial triage assessment does not replace that evaluation, and the importance of remaining in the ED until their evaluation is complete.  Pt here with lower back pain, presumed musculoskeletal. No prior imaging. No trauma or injury. No other symptoms though pain not relieved at home with '800mg'$  ibuprofen. ROM appears intact but with formal testing pt does not provide resistance. XR ordered for further evaluation.   Suzzette Righter, PA-C 06/16/22 1340

## 2022-06-16 NOTE — ED Triage Notes (Signed)
Pt complains of back pain that started Tuesday of last week. Pt states it worsens with movement. Denies trouble urinating or defecating. Denies any fall/injury.

## 2022-06-16 NOTE — ED Provider Notes (Signed)
[facility] EMERGENCY DEPARTMENT Provider Note   CSN: [id number] Arrival date & time: 06/16/22  1108     History  Chief Complaint  Patient presents with   Back Pain    Brittney [name] is a 46 y.o. female.  Patient presents emergency department complaining of lower back pain.  She states the pain has been persisting for approximately 1 week.  She denies any trauma or injury prior to onset of symptoms.  Patient denies urinary incontinence, fecal incontinence, urinary retention, saddle anesthesia, abdominal pain, nausea, vomiting.  Past medical history significant for history of chronic headaches, meningitis, depression  HPI     Home Medications Prior to Admission medications   Medication Sig Start Date End Date Taking? Authorizing Provider  meloxicam (MOBIC) 15 MG tablet Take 1 tablet (15 mg total) by mouth daily. 06/16/22  Yes Dorothyann Peng, PA-C  methocarbamol (ROBAXIN) 500 MG tablet Take 1 tablet (500 mg total) by mouth 2 (two) times daily. 06/16/22  Yes Dorothyann Peng, PA-C  predniSONE (STERAPRED UNI-PAK 21 TAB) 10 MG (21) TBPK tablet Take by mouth daily. Take 6 tabs by mouth daily  for 2 days, then 5 tabs for 2 days, then 4 tabs for 2 days, then 3 tabs for 2 days, 2 tabs for 2 days, then 1 tab by mouth daily for 2 days 06/16/22  Yes Dorothyann Peng, PA-C      Allergies    Patient has no known allergies.    Review of Systems   Review of Systems  Constitutional:  Negative for fever.  Respiratory:  Negative for shortness of breath.   Cardiovascular:  Negative for chest pain.  Genitourinary:  Negative for difficulty urinating.    Physical Exam Updated Vital Signs BP 126/76   Pulse 67   Temp 98 F (36.7 C) (Oral)   Resp 16   LMP 12/13/2020 (Exact Date)   SpO2 99%  Physical Exam Vitals and nursing note reviewed.  Constitutional:      General: She is not in acute distress.    Appearance: She is well-developed.  HENT:     Head:  Normocephalic and atraumatic.  Eyes:     Conjunctiva/sclera: Conjunctivae normal.  Cardiovascular:     Rate and Rhythm: Normal rate and regular rhythm.     Heart sounds: No murmur heard. Pulmonary:     Effort: Pulmonary effort is normal. No respiratory distress.     Breath sounds: Normal breath sounds.  Abdominal:     Palpations: Abdomen is soft.     Tenderness: There is no abdominal tenderness.  Musculoskeletal:        General: Tenderness (Mild paraspinal tenderness bilaterally in the lumbar region.  No midline spine tenderness in thoracic or lumbar regions) present. No swelling.     Cervical back: Neck supple.  Skin:    General: Skin is warm and dry.     Capillary Refill: Capillary refill takes less than 2 seconds.  Neurological:     General: No focal deficit present.     Mental Status: She is alert.     Motor: No weakness.     Coordination: Coordination normal.     Gait: Gait normal.  Psychiatric:        Mood and Affect: Mood normal.     ED Results / Procedures / Treatments   Labs (all labs ordered are listed, but only abnormal results are displayed) Labs Reviewed  PREGNANCY, URINE    EKG None  Radiology DG Lumbar Spine Complete  Result Date: 06/16/2022 CLINICAL DATA:  Low back pain in a 46 year old female. EXAM: LUMBAR SPINE - COMPLETE 4+ VIEW COMPARISON:  CT abdomen and pelvis from 2015. No recent comparison imaging is available. FINDINGS: Five lumbar type vertebral bodies with hypoplastic ribs on the last thoracic type vertebral body. No signs of loss of height of vertebral bodies. No static malalignment. Mild facet arthropathy. No substantial disc space narrowing. Mild osteophyte formation along anterior superior endplate of L4 and L5. 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 No acute findings. Electronically Signed   By: Zetta Bills M.D.   On: 06/16/2022 14:46    Procedures Procedures    Medications Ordered in ED Medications - No  data to display  ED Course/ Medical Decision Making/ A&P                           Medical Decision Making  This patient presents to the ED for concern of back pain, this involves an extensive number of treatment options, and is a complaint that carries with it a high risk of complications and morbidity.  The differential diagnosis includes degenerative disc disease, lumbar radiculopathy, fracture, dislocation, and others   Co morbidities that complicate the patient evaluation  History chronic headaches   Lab Tests:  I Ordered, and personally interpreted labs.  The pertinent results include: Negative urine pregnancy test   Imaging Studies ordered:  I ordered imaging studies including plain films of the lumbar spine I independently visualized and interpreted imaging which showed  Mild degenerative changes in the lower lumbar spine. No acute  findings.   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  Plain films show no acute findings but do show mild degenerative changes in the lower lumbar spine.  No fracture or dislocation noted.  No red flag symptoms to suggest cauda equina or to necessitate advanced imaging at this time.  Plan to discharge patient home with steroid Dosepak, methocarbamol, and meloxicam.  Patient will follow-up with her primary care provider.  Return precautions provided.        Final Clinical Impression(s) / ED Diagnoses Final diagnoses:  Acute bilateral low back pain without sciatica    Rx / DC Orders ED Discharge Orders          Ordered    meloxicam (MOBIC) 15 MG tablet  Daily        06/16/22 1648    methocarbamol (ROBAXIN) 500 MG tablet  2 times daily        06/16/22 1648    predniSONE (STERAPRED UNI-PAK 21 TAB) 10 MG (21) TBPK tablet  Daily        06/16/22 1648              Ronny Bacon 06/16/22 1650    Elgie Congo, MD 06/16/22 2256

## 2022-06-16 NOTE — Discharge Instructions (Addendum)
You were evaluated today for back pain.  Your images showed no fracture or dislocation.  I have prescribed steroids, muscle relaxants, and anti-inflammatories.  The muscle relaxant, methocarbamol, should not be used if you are planning to operate heavy machinery or drive a vehicle.  It will cause drowsiness.  Please follow up with your primary care provider.

## 2022-06-16 NOTE — ED Notes (Signed)
Pt verbalized understanding of d/c instructions, meds, and followup care. Denies questions. VSS, no distress noted. Steady gait to exit with all belongings.  ?

## 2022-06-29 ENCOUNTER — Emergency Department (HOSPITAL_COMMUNITY)
Admission: EM | Admit: 2022-06-29 | Discharge: 2022-06-30 | Disposition: A | Discharge status: Home / Self Care | Payer: Commercial Managed Care - HMO | Attending: Emergency Medicine | Admitting: Emergency Medicine

## 2022-06-29 ENCOUNTER — Encounter (HOSPITAL_COMMUNITY): Payer: Self-pay

## 2022-06-29 DIAGNOSIS — R1030 Lower abdominal pain, unspecified: Secondary | ICD-10-CM | POA: Diagnosis not present

## 2022-06-29 DIAGNOSIS — R519 Headache, unspecified: Secondary | ICD-10-CM | POA: Insufficient documentation

## 2022-06-29 DIAGNOSIS — R112 Nausea with vomiting, unspecified: Secondary | ICD-10-CM | POA: Diagnosis not present

## 2022-06-29 DIAGNOSIS — R42 Dizziness and giddiness: Secondary | ICD-10-CM | POA: Diagnosis not present

## 2022-06-29 DIAGNOSIS — G8929 Other chronic pain: Secondary | ICD-10-CM

## 2022-06-29 LAB — CBC WITH DIFFERENTIAL/PLATELET
Abs Immature Granulocytes: 0.03 10*3/uL (ref 0.00–0.07)
Basophils Absolute: 0 10*3/uL (ref 0.0–0.1)
Basophils Relative: 0 %
Eosinophils Absolute: 0 10*3/uL (ref 0.0–0.5)
Eosinophils Relative: 0 %
HCT: 42.6 % (ref 36.0–46.0)
Hemoglobin: 13.9 g/dL (ref 12.0–15.0)
Immature Granulocytes: 0 %
Lymphocytes Relative: 16 %
Lymphs Abs: 1.4 10*3/uL (ref 0.7–4.0)
MCH: 30.4 pg (ref 26.0–34.0)
MCHC: 32.6 g/dL (ref 30.0–36.0)
MCV: 93.2 fL (ref 80.0–100.0)
Monocytes Absolute: 0.1 10*3/uL (ref 0.1–1.0)
Monocytes Relative: 2 %
Neutro Abs: 7.4 10*3/uL (ref 1.7–7.7)
Neutrophils Relative %: 82 %
Platelets: 236 10*3/uL (ref 150–400)
RBC: 4.57 MIL/uL (ref 3.87–5.11)
RDW: 12.9 % (ref 11.5–15.5)
WBC: 9 10*3/uL (ref 4.0–10.5)
nRBC: 0 % (ref 0.0–0.2)

## 2022-06-29 LAB — COMPREHENSIVE METABOLIC PANEL
ALT: 20 U/L (ref 0–44)
AST: 18 U/L (ref 15–41)
Albumin: 3.8 g/dL (ref 3.5–5.0)
Alkaline Phosphatase: 70 U/L (ref 38–126)
Anion gap: 9 (ref 5–15)
BUN: 13 mg/dL (ref 6–20)
CO2: 18 mmol/L — ABNORMAL LOW (ref 22–32)
Calcium: 9.3 mg/dL (ref 8.9–10.3)
Chloride: 113 mmol/L — ABNORMAL HIGH (ref 98–111)
Creatinine, Ser: 0.54 mg/dL (ref 0.44–1.00)
GFR, Estimated: >60 mL/min (ref >60)
Glucose, Bld: 127 mg/dL — ABNORMAL HIGH (ref 70–99)
Potassium: 3.9 mmol/L (ref 3.5–5.1)
Sodium: 140 mmol/L (ref 135–145)
Total Bilirubin: 0.6 mg/dL (ref 0.3–1.2)
Total Protein: 7 g/dL (ref 6.5–8.1)

## 2022-06-29 LAB — LIPASE, BLOOD: Lipase: 27 U/L (ref 11–51)

## 2022-06-29 LAB — I-STAT BETA HCG BLOOD, ED (MC, WL, AP ONLY): I-stat hCG, quantitative: 7.1 m[IU]/mL — ABNORMAL HIGH (ref <5)

## 2022-06-29 MED ORDER — LORAZEPAM 2 MG/ML IJ SOLN
1.0000 mg | Freq: Once | INTRAMUSCULAR | Status: AC
  Administered 2022-06-29: 1 mg via INTRAVENOUS
  Filled 2022-06-29: qty 1

## 2022-06-29 MED ORDER — FENTANYL CITRATE PF 50 MCG/ML IJ SOSY
25.0000 ug | PREFILLED_SYRINGE | Freq: Once | INTRAMUSCULAR | Status: AC
  Administered 2022-06-29: 25 ug via INTRAVENOUS
  Filled 2022-06-29: qty 1

## 2022-06-29 NOTE — ED Provider Notes (Incomplete)
[facility] DEPT Provider Note   CSN: [id number] Arrival date & time: 06/29/22  1208     History {Add pertinent medical, surgical, social history, OB history to HPI:1} Chief Complaint  Patient presents with  . Abdominal Pain    Brittney [name] is a 46 y.o. female with history of chronic headaches, depression, aseptic meningitis 2007 in 2014, who presents the emergency department complaining of dizziness, abdominal pain, nausea and vomiting.  Patient states that yesterday evening with she started feeling increasingly dizzy with associated headache in the front of her head.  Symptoms are worse when she tries to stand up.  She started feeling very nauseous, but did not vomit until the next morning.  Has vomited several times, and the start developing some lower abdominal pain.  Denies any fevers, chills, neck pain, diarrhea, urinary symptoms.  Mild photophobia, but denies blurry vision/vision changes.   Abdominal Pain Associated symptoms: nausea and vomiting   Associated symptoms: no chills and no fever        Home Medications Prior to Admission medications   Medication Sig Start Date End Date Taking? Authorizing Provider  meloxicam (MOBIC) 15 MG tablet Take 1 tablet (15 mg total) by mouth daily. 06/16/22   Dorothyann Peng, PA-C  methocarbamol (ROBAXIN) 500 MG tablet Take 1 tablet (500 mg total) by mouth 2 (two) times daily. 06/16/22   Dorothyann Peng, PA-C  predniSONE (STERAPRED UNI-PAK 21 TAB) 10 MG (21) TBPK tablet Take by mouth daily. Take 6 tabs by mouth daily  for 2 days, then 5 tabs for 2 days, then 4 tabs for 2 days, then 3 tabs for 2 days, 2 tabs for 2 days, then 1 tab by mouth daily for 2 days 06/16/22   Dorothyann Peng, PA-C      Allergies    Patient has no known allergies.    Review of Systems   Review of Systems  Constitutional:  Negative for chills and fever.  Eyes:  Positive for photophobia. Negative for visual  disturbance.  Gastrointestinal:  Positive for abdominal pain, nausea and vomiting.  Neurological:  Positive for headaches.    Physical Exam Updated Vital Signs BP 134/77   Pulse 64   Temp 98.7 F (37.1 C)   Resp 20   Ht '5\' 1"'$  (1.549 m)   Wt 68 kg   LMP 12/13/2020 (Exact Date)   SpO2 99%   BMI 28.34 kg/m  Physical Exam  ED Results / Procedures / Treatments   Labs (all labs ordered are listed, but only abnormal results are displayed) Labs Reviewed  COMPREHENSIVE METABOLIC PANEL - Abnormal; Notable for the following components:      Result Value   Chloride 113 (*)    CO2 18 (*)    Glucose, Bld 127 (*)    All other components within normal limits  I-STAT BETA HCG BLOOD, ED ([facility], [facility], [facility] ONLY) - Abnormal; Notable for the following components:   I-stat hCG, quantitative 7.1 (*)    All other components within normal limits  CBC WITH DIFFERENTIAL/PLATELET  LIPASE, BLOOD  URINALYSIS, ROUTINE W REFLEX MICROSCOPIC  PREGNANCY, URINE    EKG None  Radiology No results found.  Procedures Procedures  {Document cardiac monitor, telemetry assessment procedure when appropriate:1}  Medications Ordered in ED Medications - No data to display  ED Course/ Medical Decision Making/ A&P  Medical Decision Making Amount and/or Complexity of Data Reviewed Radiology: ordered.  Risk Prescription drug management.   ***  {Document critical care time when appropriate:1} {Document review of labs and clinical decision tools ie heart score, Chads2Vasc2 etc:1}  {Document your independent review of radiology images, and any outside records:1} {Document your discussion with family members, caretakers, and with consultants:1} {Document social determinants of health affecting pt's care:1} {Document your decision making why or why not admission, treatments were needed:1} Final Clinical Impression(s) / ED Diagnoses Final diagnoses:  None    Rx / DC Orders ED  Discharge Orders     None

## 2022-06-29 NOTE — ED Provider Notes (Signed)
  [facility] DEPT Provider Note   CSN: [id number] Arrival date & time: 06/29/22  1208     History {Add pertinent medical, surgical, social history, OB history to HPI:1} Chief Complaint  Patient presents with   Abdominal Pain    Brittney [name] is a 46 y.o. female. Dizziness starting yesterday evening, started feeling nauseous Started vomiting at 6am, abd pain and headache Dizziness worsening when standing up Hx of aseptic meningitis back in 2014, recurrent headaches since   Abdominal Pain      Home Medications Prior to Admission medications   Medication Sig Start Date End Date Taking? Authorizing Provider  meloxicam (MOBIC) 15 MG tablet Take 1 tablet (15 mg total) by mouth daily. 06/16/22   Dorothyann Peng, PA-C  methocarbamol (ROBAXIN) 500 MG tablet Take 1 tablet (500 mg total) by mouth 2 (two) times daily. 06/16/22   Dorothyann Peng, PA-C  predniSONE (STERAPRED UNI-PAK 21 TAB) 10 MG (21) TBPK tablet Take by mouth daily. Take 6 tabs by mouth daily  for 2 days, then 5 tabs for 2 days, then 4 tabs for 2 days, then 3 tabs for 2 days, 2 tabs for 2 days, then 1 tab by mouth daily for 2 days 06/16/22   Dorothyann Peng, PA-C      Allergies    Patient has no known allergies.    Review of Systems   Review of Systems  Gastrointestinal:  Positive for abdominal pain.    Physical Exam Updated Vital Signs BP 134/77   Pulse 64   Temp 98.7 F (37.1 C)   Resp 20   Ht '5\' 1"'$  (1.549 m)   Wt 68 kg   LMP 12/13/2020 (Exact Date)   SpO2 99%   BMI 28.34 kg/m  Physical Exam  ED Results / Procedures / Treatments   Labs (all labs ordered are listed, but only abnormal results are displayed) Labs Reviewed  COMPREHENSIVE METABOLIC PANEL - Abnormal; Notable for the following components:      Result Value   Chloride 113 (*)    CO2 18 (*)    Glucose, Bld 127 (*)    All other components within normal limits  I-STAT BETA HCG BLOOD, ED ([facility],  [facility], [facility] ONLY) - Abnormal; Notable for the following components:   I-stat hCG, quantitative 7.1 (*)    All other components within normal limits  CBC WITH DIFFERENTIAL/PLATELET  LIPASE, BLOOD  URINALYSIS, ROUTINE W REFLEX MICROSCOPIC  PREGNANCY, URINE    EKG None  Radiology No results found.  Procedures Procedures  {Document cardiac monitor, telemetry assessment procedure when appropriate:1}  Medications Ordered in ED Medications - No data to display  ED Course/ Medical Decision Making/ A&P                           Medical Decision Making  ***  {Document critical care time when appropriate:1} {Document review of labs and clinical decision tools ie heart score, Chads2Vasc2 etc:1}  {Document your independent review of radiology images, and any outside records:1} {Document your discussion with family members, caretakers, and with consultants:1} {Document social determinants of health affecting pt's care:1} {Document your decision making why or why not admission, treatments were needed:1} Final Clinical Impression(s) / ED Diagnoses Final diagnoses:  None    Rx / DC Orders ED Discharge Orders     None

## 2022-06-29 NOTE — ED Provider Triage Note (Signed)
Emergency Medicine Provider Triage Evaluation Note  Brittney [name] , a 46 y.o. female  was evaluated in triage.  Pt complains of nausea, vomiting, and epigastric abdominal pain since yesterday.  Denies diarrhea, fever, chills, flank pain, dysuria, hematuria, chest pain, shortness of breath.  Review of Systems  Positive:  Negative:   Physical Exam  BP (!) 144/88 (BP Location: Left Arm)   Pulse 64   Resp 14   Ht '5\' 1"'$  (1.549 m)   Wt 68 kg   LMP 12/13/2020 (Exact Date)   SpO2 100%   BMI 28.34 kg/m  Gen:   Awake, no distress   Resp:  Normal effort  MSK:   Moves extremities without difficulty  Other:  Abdomen is soft, mild epigastric tenderness, no right upper quadrant or negative Murphy sign  Medical Decision Making  Medically screening exam initiated at 1:20 PM.  Appropriate orders placed.  Brittney [name] was informed that the remainder of the evaluation will be completed by another provider, this initial triage assessment does not replace that evaluation, and the importance of remaining in the ED until their evaluation is complete.     Adolphus Birchwood, PA-C 06/29/22 1321

## 2022-06-29 NOTE — ED Triage Notes (Signed)
Pt arrived via [facility], from home, n/v and abd pain. Denies any diarrhea.

## 2022-06-30 ENCOUNTER — Encounter (HOSPITAL_COMMUNITY): Payer: Self-pay

## 2022-06-30 ENCOUNTER — Emergency Department (HOSPITAL_COMMUNITY): Payer: Commercial Managed Care - HMO

## 2022-06-30 LAB — URINALYSIS, ROUTINE W REFLEX MICROSCOPIC
Bacteria, UA: NONE SEEN
Bilirubin Urine: NEGATIVE
Glucose, UA: NEGATIVE mg/dL
Ketones, ur: NEGATIVE mg/dL
Leukocytes,Ua: NEGATIVE
Nitrite: NEGATIVE
Protein, ur: NEGATIVE mg/dL
Specific Gravity, Urine: 1.013 (ref 1.005–1.030)
pH: 5 (ref 5.0–8.0)

## 2022-06-30 LAB — PREGNANCY, URINE: Preg Test, Ur: NEGATIVE

## 2022-06-30 MED ORDER — SODIUM CHLORIDE (PF) 0.9 % IJ SOLN
INTRAMUSCULAR | Status: AC
  Filled 2022-06-30: qty 50

## 2022-06-30 MED ORDER — IOHEXOL 300 MG/ML  SOLN
100.0000 mL | Freq: Once | INTRAMUSCULAR | Status: AC | PRN
  Administered 2022-06-30: 100 mL via INTRAVENOUS

## 2022-06-30 NOTE — ED Notes (Signed)
Pt called asking whether prescriptions had been sent at discharge.

## 2022-06-30 NOTE — Discharge Instructions (Addendum)
You were seen in the emergency department for dizziness and abdominal pain.  As we discussed, your blood work and scans today looked reassuring. I'm not sure what caused the flares of your headaches.   If ibuprofen is not enough, you can try combination products like Excedrin. Make sure you're drinking lots of water.  Continue to monitor how you're doing and return to the ER for new or worsening symptoms. _____________________________________________________________________________________________ Alm Bustard atendido en urgencias por mareos y dolor abdominal.  Como comentamos, sus anlisis de [place] y sus exploraciones de hoy parecan tranquilizadores. No estoy seguro de qu caus los brotes de tus dolores de [place].  Si el ibuprofeno no es suficiente, puedes probar productos combinados como Excedrin. Asegrate de beber [employer].  Contine controlando cmo se encuentra y regrese a la sala de emergencias si los sntomas aparecen o empeoran.

## 2022-06-30 NOTE — ED Notes (Signed)
Patient transported to CT 

## 2022-08-21 ENCOUNTER — Encounter: Payer: Self-pay | Admitting: Student

## 2022-08-21 ENCOUNTER — Ambulatory Visit (INDEPENDENT_AMBULATORY_CARE_PROVIDER_SITE_OTHER): Payer: BLUE CROSS/BLUE SHIELD | Admitting: Student

## 2022-08-21 VITALS — BP 136/79 | HR 74 | Temp 98.8°F | Ht 61.0 in | Wt 168.4 lb

## 2022-08-21 DIAGNOSIS — J069 Acute upper respiratory infection, unspecified: Secondary | ICD-10-CM

## 2022-08-21 DIAGNOSIS — J029 Acute pharyngitis, unspecified: Secondary | ICD-10-CM

## 2022-08-21 LAB — POC SOFIA 2 FLU + SARS ANTIGEN FIA
Influenza A, POC: NEGATIVE
Influenza B, POC: NEGATIVE
SARS Coronavirus 2 Ag: NEGATIVE

## 2022-08-21 NOTE — Progress Notes (Signed)
    SUBJECTIVE:   CHIEF COMPLAINT / HPI:   Brittney [name] is a 47 y.o. female  presenting for experiencing fevers and chills at home starting last night.  She has taken Advil and eyedrops OTC with only mild relief of symptoms.  She reports throat pain that is worse with eating and drinking.  She denies being around any sick contacts.  Denies trouble breathing, diarrhea, emesis.  PERTINENT  PMH / PSH: Chronic headaches  OBJECTIVE:   BP 136/79   Pulse 74   Temp 98.8 F (37.1 C)   Ht '5\' 1"'$  (1.549 m)   Wt 168 lb 6.4 oz (76.4 kg)   SpO2 100%   BMI 31.82 kg/m   Acutely ill-appearing, no acute distress HEENT: Eyes clear w/ bilateral drainage. Ears: clear ear canals and normal TMs without fluid collections; throat: evidence of postnasal drip with erythema, no unilateral swelling concerning for abscess; Neck: mildly enlarged cervical lymph nodes.  Cardio: Regular rate, regular rhythm, no murmurs on exam. Pulm: Clear, no wheezing, no crackles. No increased work of breathing Abdominal: bowel sounds present, soft, non-tender, non-distended Extremities: no peripheral edema    ASSESSMENT/PLAN:   Viral URI with cough Symptoms most consistent with viral infection with acute onset of symptoms.  VS are stable, lung exam normal, she is mentating appropriately.  -Encouraged patient to continue supportive care with Advil and Tylenol for fever control -Testing for flu and COVID negative in the office -Work note given     Darci Current, [place]

## 2022-08-21 NOTE — Assessment & Plan Note (Addendum)
Symptoms most consistent with viral infection with acute onset of symptoms.  VS are stable, lung exam normal, she is mentating appropriately.  -Encouraged patient to continue supportive care with Advil and Tylenol for fever control -Testing for flu and COVID negative in the office -Work note given

## 2022-08-21 NOTE — Patient Instructions (Signed)
It was great to see you today!   Today we addressed: Your fever, chills, throat pain: You most likely have a viral illness.  We are swabbing you for flu and COVID.  Although if these are negative this does not mean that you do not have a viral infection. I would expect you to get better within the next week or 2.  I am writing you a work note for next week. Make sure you are staying hydrated at home and drinking plenty of water.   You should return to our clinic Return in about 6 months (around 02/19/2023).  Please arrive 15 minutes before your appointment to ensure smooth check in process.    Please call the clinic at [phone] if your symptoms worsen or you have any concerns.  Thank you for allowing me to participate in your care, Dr. Darci Current [facility]

## 2023-05-28 ENCOUNTER — Ambulatory Visit (INDEPENDENT_AMBULATORY_CARE_PROVIDER_SITE_OTHER): Payer: BLUE CROSS/BLUE SHIELD | Admitting: Student

## 2023-05-28 ENCOUNTER — Encounter: Payer: Self-pay | Admitting: Student

## 2023-05-28 ENCOUNTER — Other Ambulatory Visit (HOSPITAL_COMMUNITY)
Admission: RE | Admit: 2023-05-28 | Discharge: 2023-05-28 | Disposition: A | Discharge status: Home / Self Care | Payer: BLUE CROSS/BLUE SHIELD | Source: Ambulatory Visit | Attending: Family Medicine | Admitting: Family Medicine

## 2023-05-28 VITALS — BP 132/92 | HR 66 | Ht 61.0 in | Wt 163.2 lb

## 2023-05-28 DIAGNOSIS — R5383 Other fatigue: Secondary | ICD-10-CM

## 2023-05-28 DIAGNOSIS — Z131 Encounter for screening for diabetes mellitus: Secondary | ICD-10-CM

## 2023-05-28 DIAGNOSIS — Z113 Encounter for screening for infections with a predominantly sexual mode of transmission: Secondary | ICD-10-CM | POA: Insufficient documentation

## 2023-05-28 DIAGNOSIS — G44229 Chronic tension-type headache, not intractable: Secondary | ICD-10-CM

## 2023-05-28 DIAGNOSIS — Z13220 Encounter for screening for lipoid disorders: Secondary | ICD-10-CM | POA: Diagnosis not present

## 2023-05-28 DIAGNOSIS — Z1211 Encounter for screening for malignant neoplasm of colon: Secondary | ICD-10-CM

## 2023-05-28 LAB — POCT GLYCOSYLATED HEMOGLOBIN (HGB A1C): HbA1c, POC (prediabetic range): 5.5 % — AB (ref 5.7–6.4)

## 2023-05-28 NOTE — Assessment & Plan Note (Signed)
Patient can planing of lack of energy more recently, denies low mood, appreciates poor sleep, waking up multiple times during the night, difficulty falling back asleep.  Patient exercises regularly, has good sleep environment, unsure what is causing her sleep disturbances however she does not want medication to assist with this at this time.  Patient denies any bleeding, or bruising, will check thyroid, electrolytes and for anemia. - BMP - CBC - TSH

## 2023-05-28 NOTE — Progress Notes (Signed)
SUBJECTIVE:   Chief compliant/HPI: annual examination  Brittney [name] is a 47 y.o. who presents today for an annual exam.   History tabs reviewed and updated   Headaches Wakes up with a slight headache in the mornings, usually takes meds for it. Noting energy level low. Has been going on for 3-4 weeks. She has a hx of HA but this is different and feels like pressure and is less intense, located on top of head. She takes ibuprofen and it helps. No changes in vision or hearing. No change related to lights or sounds. Feels find the rest of the day if she takes medicine, but will hurt all day if she doesn't.  Energy Lack of energy, not feeling depressed. Wake's up multiple times during the night. Just wakes up randomly and has difficulty going back to sleep. Sleeps at 9:30 pm and keeps environment quiet. Hour before bed, she is active doing something before sleep. Goes out for walks 3-4 x a week for 40-50 minutes.   Rash Rash on neck she noted recently   OBJECTIVE:   BP (!) 132/92   Pulse 66   Ht 5\' 1"  (1.549 m)   Wt 163 lb 3.2 oz (74 kg)   SpO2 98%   BMI 30.84 kg/m   Physical Exam Constitutional:      General: She is not in acute distress.    Appearance: Normal appearance. She is not ill-appearing.  Cardiovascular:     Rate and Rhythm: Normal rate and regular rhythm.     Pulses: Normal pulses.     Heart sounds: Normal heart sounds. No murmur heard.    No friction rub. No gallop.  Pulmonary:     Effort: Pulmonary effort is normal. No respiratory distress.     Breath sounds: Normal breath sounds. No stridor. No wheezing, rhonchi or rales.  Neurological:     Mental Status: She is alert.     Cranial Nerves: Cranial nerves 2-12 are intact. No cranial nerve deficit, dysarthria or facial asymmetry.     Sensory: Sensation is intact.     Motor: Motor function is intact. No weakness, tremor or atrophy.     Coordination: Heel to Shin Test normal.  Psychiatric:         Mood and Affect: Mood normal.        Behavior: Behavior normal.      ASSESSMENT/PLAN:   Chronic headaches Plan of headaches that are mild in nature, happen most mornings, after she wakes up.  Headaches well treated with ibuprofen and tend to go away.  Patient with normal neuroexam, denies any infectious symptoms, denies any photo/auditory sensitivity, or worsening headache with activity.  Patient's headaches most likely tension type headache, low concern for migraine, infection, cluster headaches, subarachnoid hemorrhage.  Will recommend NSAIDs as needed. - Ibuprofen as needed  Low energy Patient can planing of lack of energy more recently, denies low mood, appreciates poor sleep, waking up multiple times during the night, difficulty falling back asleep.  Patient exercises regularly, has good sleep environment, unsure what is causing her sleep disturbances however she does not want medication to assist with this at this time.  Patient denies any bleeding, or bruising, will check thyroid, electrolytes and for anemia. - BMP - CBC - TSH    Annual Examination  See AVS for age appropriate recommendations.   PHQ score Mood is fine, no sadness or depression  Blood pressure reviewed and nearly at goal, will recheck in future.  Asked about intimate partner violence and resources given as appropriate  The patient currently has tubal ligation for contraception.   Considered the following items based upon USPSTF recommendations: Diabetes screening: discussed and ordered Screening for elevated cholesterol: discussed and ordered HIV testing: discussed and ordered Hepatitis C: discussed Neg 04/27/07 Hepatitis B: discussed Neg 04/09/22 Syphilis if at high risk: discussed and ordered GC/CT at high risk, ordered.  Neg 04/09/22 Reviewed risk factors for latent tuberculosis and not indicated Reviewed risk factors for osteoporosis. Using FRAX tool estimated risk of major osteoporotic fracture of  2.7%,  early screening not ordered   Discussed family history, BRCA testing not indicated. Tool used to risk stratify was.  Cervical cancer screening: prior Pap reviewed, repeat due in 2028 Breast cancer screening: discussed potential benefits, risks including overdiagnosis and biopsy, elected proceed with mammogram, did not get it done 2/2 to cost.  Colorectal cancer screening: discussed, colonoscopy ordered if age 71 or over.   Follow up in 1  year or sooner if indicated.    Bess Kinds, MD [facility]

## 2023-05-28 NOTE — Patient Instructions (Addendum)
It was great to see you! Thank you for allowing me to participate in your care!  Our plans for today:  - Check Up  Diabetes testing  You do not have Diabetes Sexually Transmitted infection Testing  Testing you for Sexually transmitted diseases Checking your cholesterol  Checking your cholesterol  Screening for Colon Cancer  Someone will contact you about an appointment Screening for Mammogram  If you can't get this year, we can collect next year Headaches  These are normal headaches, continue to take ibuprofen for them [employer]  This is coming from your low sleep, but we are going to check some labs  We are checking some labs today, I will call you if they are abnormal will send you a MyChart message or a letter if they are normal.  If you do not hear about your labs in the next 2 weeks please let us know.  Take care and seek immediate care sooner if you develop any concerns.   Dr. Bess Kinds, MD [facility]   In Spanish Fue genial verte! Gracias por permitirme participar en su cuidado!  Nuestros planes para hoy:  - Chequeo  Pruebas de diabetes  No tienes Diabetes Pruebas de infecciones de transmisin sexual  Pruebas de enfermedades de transmisin sexual Controlando tu colesterol  Controlando tu colesterol  Deteccin de cncer de colon  Alguien se comunicar contigo para concertar una cita. Deteccin de mamografa  Si no puedes conseguir [employer], podemos recoger el ao que viene. dolores de [place]  Estos son dolores de cabeza normales, contina tomando ibuprofeno para ellos. Energa  Esto se debe a tu falta de sueo, pero vamos a revisar algunos laboratorios.  Estamos revisando algunos anlisis de laboratorio hoy. Lo llamar si son anormales y [employer] un mensaje de MyChart o Neomia Dear carta si son normales.  Si no tiene noticias sobre sus laboratorios en las prximas 2 semanas, hganoslo saber.  Tenga cuidado y busque atencin inmediata lo antes posible si  tiene alguna inquietud.   Dr. Bess Kinds, MD [facility] [place]

## 2023-05-28 NOTE — Assessment & Plan Note (Signed)
Plan of headaches that are mild in nature, happen most mornings, after she wakes up.  Headaches well treated with ibuprofen and tend to go away.  Patient with normal neuroexam, denies any infectious symptoms, denies any photo/auditory sensitivity, or worsening headache with activity.  Patient's headaches most likely tension type headache, low concern for migraine, infection, cluster headaches, subarachnoid hemorrhage.  Will recommend NSAIDs as needed. - Ibuprofen as needed

## 2023-05-29 LAB — CBC
Hematocrit: 41.6 % (ref 34.0–46.6)
Hemoglobin: 13.6 g/dL (ref 11.1–15.9)
MCH: 30.8 pg (ref 26.6–33.0)
MCHC: 32.7 g/dL (ref 31.5–35.7)
MCV: 94 fL (ref 79–97)
Platelets: 229 10*3/uL (ref 150–450)
RBC: 4.41 x10E6/uL (ref 3.77–5.28)
RDW: 12.6 % (ref 11.7–15.4)
WBC: 5 10*3/uL (ref 3.4–10.8)

## 2023-05-29 LAB — BASIC METABOLIC PANEL
BUN/Creatinine Ratio: 20 (ref 9–23)
BUN: 13 mg/dL (ref 6–24)
CO2: 23 mmol/L (ref 20–29)
Calcium: 9.2 mg/dL (ref 8.7–10.2)
Chloride: 108 mmol/L — ABNORMAL HIGH (ref 96–106)
Creatinine, Ser: 0.66 mg/dL (ref 0.57–1.00)
Glucose: 89 mg/dL (ref 70–99)
Potassium: 3.9 mmol/L (ref 3.5–5.2)
Sodium: 144 mmol/L (ref 134–144)
eGFR: 108 mL/min/{1.73_m2} (ref >59)

## 2023-05-29 LAB — LIPID PANEL
Chol/HDL Ratio: 3.7 ratio (ref 0.0–4.4)
Cholesterol, Total: 241 mg/dL — ABNORMAL HIGH (ref 100–199)
HDL: 65 mg/dL (ref >39)
LDL Chol Calc (NIH): 156 mg/dL — ABNORMAL HIGH (ref 0–99)
Triglycerides: 115 mg/dL (ref 0–149)
VLDL Cholesterol Cal: 20 mg/dL (ref 5–40)

## 2023-05-29 LAB — HIV ANTIBODY (ROUTINE TESTING W REFLEX): HIV Screen 4th Generation wRfx: NONREACTIVE

## 2023-05-31 LAB — CERVICOVAGINAL ANCILLARY ONLY
Chlamydia: NEGATIVE
Comment: NEGATIVE
Comment: NEGATIVE
Comment: NORMAL
Neisseria Gonorrhea: NEGATIVE
Trichomonas: NEGATIVE

## 2023-05-31 LAB — RPR W/REFLEX TO TREPSURE: RPR: NONREACTIVE

## 2023-05-31 LAB — TREPONEMAL ANTIBODIES, TPPA: Treponemal Antibodies, TPPA: NONREACTIVE

## 2023-05-31 LAB — TSH RFX ON ABNORMAL TO FREE T4: TSH: 0.854 u[IU]/mL (ref 0.450–4.500)

## 2023-06-04 ENCOUNTER — Encounter: Payer: Self-pay | Admitting: Student

## 2023-12-28 ENCOUNTER — Encounter: Payer: Self-pay | Admitting: *Deleted
